# Patient Record
Sex: Female | Born: 1960 | Race: White | Hispanic: No | Smoking: Never smoker
Health system: Southern US, Community
[De-identification: ages and names within clinical notes are randomized; demographics above are authoritative.]

## PROBLEM LIST (undated history)

## (undated) DIAGNOSIS — I82409 Acute embolism and thrombosis of unspecified deep veins of unspecified lower extremity: Secondary | ICD-10-CM

## (undated) DIAGNOSIS — I10 Essential (primary) hypertension: Secondary | ICD-10-CM

## (undated) DIAGNOSIS — F329 Major depressive disorder, single episode, unspecified: Secondary | ICD-10-CM

## (undated) DIAGNOSIS — F419 Anxiety disorder, unspecified: Secondary | ICD-10-CM

## (undated) DIAGNOSIS — F32A Depression, unspecified: Secondary | ICD-10-CM

## (undated) DIAGNOSIS — I2699 Other pulmonary embolism without acute cor pulmonale: Secondary | ICD-10-CM

## (undated) HISTORY — PX: ABDOMINAL HYSTERECTOMY: SHX81

## (undated) HISTORY — PX: BACK SURGERY: SHX140

## (undated) HISTORY — PX: CHOLECYSTECTOMY: SHX55

## (undated) HISTORY — PX: APPENDECTOMY: SHX54

---

## 1998-03-14 ENCOUNTER — Inpatient Hospital Stay (HOSPITAL_COMMUNITY): Admission: RE | Admit: 1998-03-14 | Discharge: 1998-03-16 | Payer: Self-pay | Admitting: Neurosurgery

## 1998-08-22 ENCOUNTER — Encounter: Payer: Self-pay | Admitting: Neurosurgery

## 1998-08-22 ENCOUNTER — Inpatient Hospital Stay (HOSPITAL_COMMUNITY): Admission: RE | Admit: 1998-08-22 | Discharge: 1998-08-25 | Payer: Self-pay | Admitting: Neurosurgery

## 1999-12-29 ENCOUNTER — Encounter: Payer: Self-pay | Admitting: Neurosurgery

## 1999-12-29 ENCOUNTER — Encounter: Admission: RE | Admit: 1999-12-29 | Discharge: 1999-12-29 | Payer: Self-pay | Admitting: Neurosurgery

## 2000-01-10 ENCOUNTER — Encounter: Payer: Self-pay | Admitting: Neurosurgery

## 2000-01-10 ENCOUNTER — Ambulatory Visit (HOSPITAL_COMMUNITY): Admission: RE | Admit: 2000-01-10 | Discharge: 2000-01-10 | Payer: Self-pay | Admitting: Neurosurgery

## 2002-04-26 ENCOUNTER — Ambulatory Visit (HOSPITAL_COMMUNITY): Admission: RE | Admit: 2002-04-26 | Discharge: 2002-04-26 | Payer: Self-pay | Admitting: Neurosurgery

## 2002-04-26 ENCOUNTER — Encounter: Payer: Self-pay | Admitting: Neurosurgery

## 2002-05-14 ENCOUNTER — Encounter: Payer: Self-pay | Admitting: Neurosurgery

## 2002-05-16 ENCOUNTER — Encounter: Payer: Self-pay | Admitting: Neurosurgery

## 2002-05-16 ENCOUNTER — Inpatient Hospital Stay (HOSPITAL_COMMUNITY): Admission: RE | Admit: 2002-05-16 | Discharge: 2002-05-18 | Payer: Self-pay | Admitting: Neurosurgery

## 2002-11-08 ENCOUNTER — Encounter: Admission: RE | Admit: 2002-11-08 | Discharge: 2002-11-08 | Payer: Self-pay | Admitting: Orthopaedic Surgery

## 2002-11-08 ENCOUNTER — Encounter: Payer: Self-pay | Admitting: Orthopaedic Surgery

## 2003-04-16 ENCOUNTER — Encounter: Payer: Self-pay | Admitting: Emergency Medicine

## 2003-04-16 ENCOUNTER — Ambulatory Visit (HOSPITAL_COMMUNITY): Admission: RE | Admit: 2003-04-16 | Discharge: 2003-04-16 | Payer: Self-pay | Admitting: Neurosurgery

## 2003-04-16 ENCOUNTER — Emergency Department (HOSPITAL_COMMUNITY): Admission: AD | Admit: 2003-04-16 | Discharge: 2003-04-16 | Payer: Self-pay | Admitting: Emergency Medicine

## 2003-04-16 ENCOUNTER — Encounter: Payer: Self-pay | Admitting: Neurosurgery

## 2004-05-10 ENCOUNTER — Ambulatory Visit (HOSPITAL_COMMUNITY): Admission: RE | Admit: 2004-05-10 | Discharge: 2004-05-10 | Payer: Self-pay | Admitting: Neurosurgery

## 2004-12-20 ENCOUNTER — Ambulatory Visit (HOSPITAL_COMMUNITY): Admission: RE | Admit: 2004-12-20 | Discharge: 2004-12-20 | Payer: Self-pay | Admitting: Neurosurgery

## 2005-03-15 ENCOUNTER — Emergency Department (HOSPITAL_COMMUNITY): Admission: EM | Admit: 2005-03-15 | Discharge: 2005-03-15 | Payer: Self-pay | Admitting: Emergency Medicine

## 2005-07-15 ENCOUNTER — Ambulatory Visit: Payer: Self-pay | Admitting: Cardiology

## 2005-09-01 ENCOUNTER — Emergency Department (HOSPITAL_COMMUNITY): Admission: EM | Admit: 2005-09-01 | Discharge: 2005-09-01 | Payer: Self-pay | Admitting: Emergency Medicine

## 2005-09-01 ENCOUNTER — Ambulatory Visit (HOSPITAL_COMMUNITY): Admission: RE | Admit: 2005-09-01 | Discharge: 2005-09-01 | Payer: Self-pay | Admitting: Neurosurgery

## 2007-11-06 ENCOUNTER — Ambulatory Visit: Payer: Self-pay | Admitting: Cardiology

## 2008-05-15 ENCOUNTER — Emergency Department (HOSPITAL_COMMUNITY): Admission: EM | Admit: 2008-05-15 | Discharge: 2008-05-16 | Payer: Self-pay | Admitting: Emergency Medicine

## 2010-01-16 ENCOUNTER — Ambulatory Visit: Payer: Self-pay | Admitting: Cardiology

## 2010-10-25 ENCOUNTER — Encounter: Payer: Self-pay | Admitting: Neurosurgery

## 2011-02-19 NOTE — H&P (Signed)
Deanna Brandt, Deanna Brandt                       ACCOUNT NO.:  [id number]   MEDICAL RECORD NO.:  [id number]                   PATIENT TYPE:  OIB   LOCATION:  3011                                 FACILITY:  [facility]   PHYSICIAN:  Payton Doughty, M.D.                   DATE OF BIRTH:  06/08/1961   DATE OF ADMISSION:  05/16/2002  DATE OF DISCHARGE:  05/18/2002                                HISTORY & PHYSICAL   ADMISSION DIAGNOSES:  Herniated disc, C6-C7 on the left.   HISTORY OF PRESENT ILLNESS:  This is a now 50 year old right-handed white  female who has been my patient for a number of years.  In June she started  complaining of pain in her shoulder and left arm, numbness into the index  and middle fingers on the left hand.  After much ado an MRI was obtained  that demonstrated a herniated disc at C6-C7 on the left and she is admitted  for anterior cervicectomy and fusion.   PAST MEDICAL HISTORY:  Her medical history is remarkable for a lumbar fusion  in 1999 at L4-L5.  Other than that she does not have any medical  difficulties.   MEDICATIONS ON ADMISSION:  Estrace and Vicodin on a p.r.n. basis.   PAST SURGICAL HISTORY:  She has also had a hysterectomy in the past.   ALLERGIES:  She has no allergies.   SOCIAL HISTORY:  She does not smoke and does not drink.  She is currently  unemployed.   FAMILY HISTORY:  Mother is 42 and in fair health.  Father is 27 and in fair  health.  No medical problems are elicited.   REVIEW OF SYSTEMS:  Remarkable for neck, shoulder and arm pain.   PHYSICAL EXAMINATION:  HEENT:  Examination is within normal limits.  NECK:  Supple with no lymphadenopathy.  Turning it towards the left  reproduces her shoulder and arm pain, as does extension.  CHEST:  Clear.  CARDIOVASCULAR:  Regular rate and rhythm.  ABDOMEN:  Nontender with no hepatosplenomegaly.  EXTREMITIES:  Without clubbing or cyanosis.  Peripheral pulses are good.  GENITOURINARY:  Examination is  deferred.  NEUROLOGIC:  She is awake, alert and oriented.  Cranial nerves are intact.  Motor examination shows 5 out of 5 strength throughout the upper and lower  extremities save for the left triceps which is 4 over 5.  In the biceps  feels like there is give-way weakness but could be mustered to full  strength.  Left C7 sensory deficit is described.  Deep tendon reflexes are 2  at the biceps bilaterally, absent at the left triceps, and 1 at the right  triceps.  Hoffmann's is negative.   MRI demonstrates a herniated disc at C6-C7 and in the left neural foramen.   CLINICAL IMPRESSION:  Left C7 radiculopathy related to herniated disc at C6-  C7.   PLAN:  The  plan is for anterior cervicectomy and fusion at C6-C7.  The risks  and benefits of this approach have been discussed with her and she wishes to  proceed.                                               Payton Doughty, M.D.    MWR/MEDQ  D:  05/16/2002  T:  05/19/2002  Job:  [id number]

## 2011-02-19 NOTE — Op Note (Signed)
[name], Deanna Brandt                       ACCOUNT NO.:  [id number]   MEDICAL RECORD NO.:  [id number]                   PATIENT TYPE:  OIB   LOCATION:  3011                                 FACILITY:  [facility]   PHYSICIAN:  Payton Doughty, M.D.                   DATE OF BIRTH:  [birth year]   DATE OF PROCEDURE:  05/16/2002  DATE OF DISCHARGE:                                 OPERATIVE REPORT   PREOPERATIVE DIAGNOSIS:  Herniated disk C6-7.   POSTOPERATIVE DIAGNOSIS:  Herniated disk C6-7.   OPERATIVE PROCEDURE:  C6-7 anterior cervical discectomy and fusion.  Feather  plate.   SURGEON:  Payton Doughty, M.D.   ANESTHESIA:  General endotracheal.   __________  with alcohol wipe.   COMPLICATIONS:  None.   ASSISTANT:  [place].   BODY OF TEXT:  A 50 year old right-handed white girl with a left C7  radiculopathy, secondary to herniated disk at C6-7.  She was taken to the  operating room, intubated and placed supine on the operating room table.  The patient was shaved, prepped and draped in the usual sterile fashion.  Skin was incised in the midline to the sternocleidomastoid muscle on the  left side.  The platysma was identified, elevated and divided and  undermined.  The sternocleidomastoid was identified and medial dissection  around the carotid artery, retracted laterally to the left.  Trachea and  esophagus were retracted laterally to the right, exposing the bone to the  anterior cervical spine.  Marker was placed.  Intraoperative x-ray obtained  to confirm correctness of level.  After confirming the correctness of level,  dissection is carried out under gross observation.  The operating microscope  was then brought in and microdissection technique used to the dissect the  interior epidural space, remove the disk and carefully dissect the nerve  roots.  On the left side there is a large fragment of annulus that had been  pushed into the neural foramen, significantly compressing the C7  root.  This  was removed without difficulty.  The nerve root carefully explored and found  to be free in all quadrants.  Bleeding was controlled with thrombin-soaked  Gelfoam, which was subsequently removed.  The wound was irrigated and  hemostasis assured.  A 7 mm bone graft fashioned from the patellar allograft  and tapped into place.  A 14 mm toe-in plate was then used with 12 mm screws  -- two in C6 and two in C7.  Intraoperative x-ray confirmed correctness of  level, good placement of bone graft, plate and screws.  The platysma was  reapproximated with 0 Vicryl in interrupted fashion.  The subcutaneous  tissue was reapproximated with 3-0 Vicryl in interrupted fashion.  The skin  was closed with 4-0 Vicryl in running subcuticular fashion.  Benzoin and  Steri-Strips were placed.  Made occlusive with Telfa and OpSite.  The  patient placed in  an Biochemist, clinical; returned to the recovery room in good  condition.                                               Payton Doughty, M.D.   MWR/MEDQ  D:  05/16/2002  T:  05/18/2002  Job:  [id number]

## 2011-06-08 ENCOUNTER — Other Ambulatory Visit: Payer: Self-pay

## 2011-06-08 ENCOUNTER — Emergency Department (HOSPITAL_COMMUNITY): Payer: Medicare Other

## 2011-06-08 ENCOUNTER — Observation Stay (HOSPITAL_COMMUNITY)
Admission: EM | Admit: 2011-06-08 | Discharge: 2011-06-09 | Disposition: A | Discharge status: Home / Self Care | Payer: Medicare Other | Attending: Internal Medicine | Admitting: Internal Medicine

## 2011-06-08 ENCOUNTER — Encounter: Payer: Self-pay | Admitting: Emergency Medicine

## 2011-06-08 DIAGNOSIS — Z79899 Other long term (current) drug therapy: Secondary | ICD-10-CM | POA: Insufficient documentation

## 2011-06-08 DIAGNOSIS — R109 Unspecified abdominal pain: Secondary | ICD-10-CM | POA: Insufficient documentation

## 2011-06-08 DIAGNOSIS — F411 Generalized anxiety disorder: Secondary | ICD-10-CM | POA: Insufficient documentation

## 2011-06-08 DIAGNOSIS — M549 Dorsalgia, unspecified: Secondary | ICD-10-CM | POA: Insufficient documentation

## 2011-06-08 DIAGNOSIS — D649 Anemia, unspecified: Secondary | ICD-10-CM | POA: Insufficient documentation

## 2011-06-08 DIAGNOSIS — R0789 Other chest pain: Principal | ICD-10-CM | POA: Insufficient documentation

## 2011-06-08 DIAGNOSIS — R079 Chest pain, unspecified: Secondary | ICD-10-CM | POA: Diagnosis present

## 2011-06-08 DIAGNOSIS — F329 Major depressive disorder, single episode, unspecified: Secondary | ICD-10-CM | POA: Diagnosis present

## 2011-06-08 DIAGNOSIS — Z86718 Personal history of other venous thrombosis and embolism: Secondary | ICD-10-CM | POA: Insufficient documentation

## 2011-06-08 DIAGNOSIS — F3289 Other specified depressive episodes: Secondary | ICD-10-CM | POA: Insufficient documentation

## 2011-06-08 DIAGNOSIS — R0602 Shortness of breath: Secondary | ICD-10-CM | POA: Insufficient documentation

## 2011-06-08 DIAGNOSIS — G8929 Other chronic pain: Secondary | ICD-10-CM | POA: Insufficient documentation

## 2011-06-08 DIAGNOSIS — R112 Nausea with vomiting, unspecified: Secondary | ICD-10-CM | POA: Insufficient documentation

## 2011-06-08 HISTORY — DX: Major depressive disorder, single episode, unspecified: F32.9

## 2011-06-08 HISTORY — DX: Acute embolism and thrombosis of unspecified deep veins of unspecified lower extremity: I82.409

## 2011-06-08 HISTORY — DX: Anxiety disorder, unspecified: F41.9

## 2011-06-08 HISTORY — DX: Depression, unspecified: F32.A

## 2011-06-08 LAB — TROPONIN I
Troponin I: <0.3 ng/mL (ref <0.30)
Troponin I: <0.3 ng/mL (ref <0.30)

## 2011-06-08 LAB — D-DIMER, QUANTITATIVE: D-Dimer, Quant: 0.53 ug/mL-FEU — ABNORMAL HIGH (ref 0.00–0.48)

## 2011-06-08 LAB — CBC
HCT: 34.9 % — ABNORMAL LOW (ref 36.0–46.0)
Hemoglobin: 11.7 g/dL — ABNORMAL LOW (ref 12.0–15.0)
MCH: 28.6 pg (ref 26.0–34.0)
MCHC: 33.5 g/dL (ref 30.0–36.0)
MCV: 85.3 fL (ref 78.0–100.0)
Platelets: 218 10*3/uL (ref 150–400)
RBC: 4.09 MIL/uL (ref 3.87–5.11)
RDW: 14.3 % (ref 11.5–15.5)
WBC: 6.8 10*3/uL (ref 4.0–10.5)

## 2011-06-08 LAB — COMPREHENSIVE METABOLIC PANEL
ALT: 16 U/L (ref 0–35)
BUN: 17 mg/dL (ref 6–23)
Chloride: 104 mEq/L (ref 96–112)
GFR calc non Af Amer: >60 mL/min (ref >60)
Glucose, Bld: 106 mg/dL — ABNORMAL HIGH (ref 70–99)

## 2011-06-08 LAB — URINALYSIS, ROUTINE W REFLEX MICROSCOPIC
Ketones, ur: NEGATIVE mg/dL
Leukocytes, UA: NEGATIVE
Nitrite: NEGATIVE
Protein, ur: NEGATIVE mg/dL
Urobilinogen, UA: 0.2 mg/dL (ref 0.0–1.0)

## 2011-06-08 LAB — CK TOTAL AND CKMB (NOT AT ARMC)
Relative Index: INVALID (ref 0.0–2.5)
Total CK: 73 U/L (ref 7–177)

## 2011-06-08 MED ORDER — MORPHINE SULFATE 10 MG/ML IJ SOLN
INTRAMUSCULAR | Status: AC
  Administered 2011-06-08: 6 mg
  Filled 2011-06-08: qty 1

## 2011-06-08 MED ORDER — MORPHINE SULFATE 4 MG/ML IJ SOLN
4.0000 mg | Freq: Once | INTRAMUSCULAR | Status: AC
  Administered 2011-06-08: 4 mg via INTRAVENOUS
  Filled 2011-06-08: qty 1

## 2011-06-08 MED ORDER — NITROGLYCERIN 0.4 MG SL SUBL
0.4000 mg | SUBLINGUAL_TABLET | Freq: Once | SUBLINGUAL | Status: AC
  Administered 2011-06-08: 0.4 mg via SUBLINGUAL
  Filled 2011-06-08: qty 25

## 2011-06-08 MED ORDER — MORPHINE SULFATE 4 MG/ML IJ SOLN
INTRAMUSCULAR | Status: AC
  Administered 2011-06-08: 4 mg
  Filled 2011-06-08: qty 1

## 2011-06-08 MED ORDER — PROMETHAZINE HCL 25 MG/ML IJ SOLN
25.0000 mg | Freq: Four times a day (QID) | INTRAMUSCULAR | Status: DC | PRN
  Administered 2011-06-08: 25 mg via INTRAVENOUS
  Filled 2011-06-08: qty 1

## 2011-06-08 MED ORDER — CLOPIDOGREL BISULFATE 75 MG PO TABS
150.0000 mg | ORAL_TABLET | Freq: Once | ORAL | Status: AC
  Administered 2011-06-08: 150 mg via ORAL
  Filled 2011-06-08: qty 2

## 2011-06-08 MED ORDER — LORAZEPAM 2 MG/ML IJ SOLN
1.0000 mg | Freq: Once | INTRAMUSCULAR | Status: AC
  Administered 2011-06-08: 1 mg via INTRAVENOUS
  Filled 2011-06-08: qty 1

## 2011-06-08 NOTE — ED Provider Notes (Signed)
History  Scribed for Dr. Patria Mane the patient was seen in room 12. The chart was scribed by Gilman Schmidt. The patients care was started at 1628.  CSN: [id number] Arrival date & time: 06/08/2011  3:45 PM  Chief Complaint  Patient presents with  . Headache  . Chest Pain  . Shoulder Pain  . Emesis   HPI Deanna Brandt is a 50 y.o. female who presents to the Emergency Department complaining of constant chest pain that began ~1 hour ago. Pt describes the chest pain as feeling like someone is sitting on her chest and reports that is is radiating to her left shoulder. Pt. Reports that chest pain is worsening but is not exacerbated by deep breaths or palpations. Additionally, patient reports a history of blood clot in the legs and states that she was on Coumadin for three months. Associated symptoms include headache, dysuria, nausea but denies any new swelling, fever, cough, congestion, or SOB.   HPI ELEMENTS:  Location: chest Onset: ~1 hour ago Duration: consistent since onset  Timing: constant  Quality: "feels like someone is sitting on her chest" Context:  as above  Associated symptoms: headache and nausea but denies any new swelling, fever, cough, congestion, or SOB.     PAST MEDICAL HISTORY:  Past Medical History  Diagnosis Date  . Depression   . Anxiety   . Migraine   . DVT (deep venous thrombosis)   Denies any history of hypertension, hyperlipidemia, DM   PAST SURGICAL HISTORY:  Past Surgical History  Procedure Date  . Back surgery   . Cholecystectomy   . Appendectomy   . Abdominal hysterectomy      MEDICATIONS:  Previous Medications   ALPRAZOLAM (XANAX) 1 MG TABLET    Take 1 mg by mouth 3 (three) times daily as needed. For anxiety     TIZANIDINE (ZANAFLEX) 2 MG CAPSULE    Take 4 mg by mouth 3 (three) times daily as needed. For muscle spasms    ZIPRASIDONE (GEODON) 60 MG CAPSULE    Take 60 mg by mouth daily.     ZOLPIDEM (AMBIEN) 10 MG TABLET    Take 10 mg by mouth at  bedtime as needed. For sleep      ALLERGIES:  Allergies as of 06/08/2011 - Review Complete 06/08/2011  Allergen Reaction Noted  . Aspirin Anaphylaxis 06/08/2011  . Bee venom Anaphylaxis 06/08/2011  . Compazine Hives 06/08/2011  . Ibuprofen Nausea And Vomiting 06/08/2011  . Zofran Hives 06/08/2011  . Ivp dye (iodinated diagnostic agents) Rash 06/08/2011     FAMILY HISTORY:  Denies any young heart problems in family.    SOCIAL HISTORY: Pt reports recent travel to see her daughter who has been having seizures.  History  Substance Use Topics  . Smoking status: Never Smoker   . Smokeless tobacco: Not on file  . Alcohol Use: No   Denies any smoking.   Review of Systems  Constitutional: Negative for fever.  HENT: Negative for congestion.   Respiratory: Negative for cough and shortness of breath.   Cardiovascular: Positive for chest pain.  Gastrointestinal: Positive for nausea and vomiting.  Genitourinary: Positive for dysuria.  Musculoskeletal:       Shoulder pain  Neurological: Positive for headaches.  All other systems reviewed and are negative.    Physical Exam  BP 144/96  Pulse 88  Temp(Src) 98.9 F (37.2 C) (Oral)  Resp 17  Ht 5\' 5"  (1.651 m)  Wt 165 lb (74.844 kg)  BMI  27.46 kg/m2  SpO2 100%  Physical Exam  Constitutional: She is oriented to person, place, and time. She appears well-developed and well-nourished.  HENT:  Head: Normocephalic.  Eyes: Pupils are equal, round, and reactive to light.  Neck: Normal range of motion and phonation normal.  Cardiovascular: Normal rate, regular rhythm, normal heart sounds and intact distal pulses.   Pulmonary/Chest: Effort normal and breath sounds normal. She exhibits no bony tenderness.  Abdominal: Soft. Normal appearance. There is tenderness in the right upper quadrant, epigastric area and suprapubic area.  Musculoskeletal: Normal range of motion.       No unilateral swelling of legs  Neurological: She is alert and  oriented to person, place, and time. She has normal strength. No cranial nerve deficit or sensory deficit. She exhibits normal muscle tone. Coordination normal.  Skin: Skin is warm, dry and intact.  Psychiatric: She has a normal mood and affect. Her behavior is normal. Judgment and thought content normal.   OTHER DATA REVIEWED: Nursing notes, vital signs, and past medical records reviewed.   DIAGNOSTIC STUDIES: Oxygen Saturation is 100% on room air, normal by my interpretation.    LABS:  Results for orders placed during the hospital encounter of 06/08/11  CBC      Component Value Range   WBC 6.8  4.0 - 10.5 (K/uL)   RBC 4.09  3.87 - 5.11 (MIL/uL)   Hemoglobin 11.7 (*) 12.0 - 15.0 (g/dL)   HCT 04.5 (*) 40.9 - 46.0 (%)   MCV 85.3  78.0 - 100.0 (fL)   MCH 28.6  26.0 - 34.0 (pg)   MCHC 33.5  30.0 - 36.0 (g/dL)   RDW 81.1  91.4 - 78.2 (%)   Platelets 218  150 - 400 (K/uL)  TROPONIN I      Component Value Range   Troponin I <0.30  <0.30 (ng/mL)  CK TOTAL AND CKMB      Component Value Range   Total CK 73  7 - 177 (U/L)   CK, MB 2.9  0.3 - 4.0 (ng/mL)   Relative Index RELATIVE INDEX IS INVALID  0.0 - 2.5   LIPASE, BLOOD      Component Value Range   Lipase 58  11 - 59 (U/L)  COMPREHENSIVE METABOLIC PANEL      Component Value Range   Sodium 137  135 - 145 (mEq/L)   Potassium 3.4 (*) 3.5 - 5.1 (mEq/L)   Chloride 104  96 - 112 (mEq/L)   CO2 24  19 - 32 (mEq/L)   Glucose, Bld 106 (*) 70 - 99 (mg/dL)   BUN 17  6 - 23 (mg/dL)   Creatinine, Ser 9.56  0.50 - 1.10 (mg/dL)   Calcium 9.5  8.4 - 21.3 (mg/dL)   Total Protein 7.3  6.0 - 8.3 (g/dL)   Albumin 4.3  3.5 - 5.2 (g/dL)   AST 13  0 - 37 (U/L)   ALT 16  0 - 35 (U/L)   Alkaline Phosphatase 105  39 - 117 (U/L)   Total Bilirubin 2.0 (*) 0.3 - 1.2 (mg/dL)   GFR calc non Af Amer >60  >60 (mL/min)   GFR calc Af Amer >60  >60 (mL/min)  URINALYSIS, ROUTINE W REFLEX MICROSCOPIC      Component Value Range   Color, Urine AMBER (*) YELLOW     Appearance CLEAR  CLEAR    Specific Gravity, Urine 1.020  1.005 - 1.030    pH 6.0  5.0 - 8.0  Glucose, UA NEGATIVE  NEGATIVE (mg/dL)   Hgb urine dipstick NEGATIVE  NEGATIVE    Bilirubin Urine NEGATIVE  NEGATIVE    Ketones, ur NEGATIVE  NEGATIVE (mg/dL)   Protein, ur NEGATIVE  NEGATIVE (mg/dL)   Urobilinogen, UA 0.2  0.0 - 1.0 (mg/dL)   Nitrite NEGATIVE  NEGATIVE    Leukocytes, UA NEGATIVE  NEGATIVE   TROPONIN I      Component Value Range   Troponin I <0.30  <0.30 (ng/mL)  D-DIMER, QUANTITATIVE      Component Value Range   D-Dimer, Quant 0.53 (*) 0.00 - 0.48 (ug/mL-FEU)  PRO B NATRIURETIC PEPTIDE      Component Value Range   BNP, POC 15.6  0 - 125 (pg/mL)  RETICULOCYTES      Component Value Range   Retic Ct Pct 1.1  0.4 - 3.1 (%)   RBC. 4.09  3.87 - 5.11 (MIL/uL)   Retic Count, Manual 45.0  19.0 - 186.0 (K/uL)  PROTIME-INR      Component Value Range   Prothrombin Time 18.0 (*) 11.6 - 15.2 (seconds)   INR 1.46  0.00 - 1.49   APTT      Component Value Range   aPTT 36  24 - 37 (seconds)  COMPREHENSIVE METABOLIC PANEL      Component Value Range   Sodium 142  135 - 145 (mEq/L)   Potassium 3.7  3.5 - 5.1 (mEq/L)   Chloride 110  96 - 112 (mEq/L)   CO2 24  19 - 32 (mEq/L)   Glucose, Bld 89  70 - 99 (mg/dL)   BUN 10  6 - 23 (mg/dL)   Creatinine, Ser 4.54  0.50 - 1.10 (mg/dL)   Calcium 9.3  8.4 - 09.8 (mg/dL)   Total Protein 6.8  6.0 - 8.3 (g/dL)   Albumin 3.9  3.5 - 5.2 (g/dL)   AST 13  0 - 37 (U/L)   ALT 14  0 - 35 (U/L)   Alkaline Phosphatase 97  39 - 117 (U/L)   Total Bilirubin 2.0 (*) 0.3 - 1.2 (mg/dL)   GFR calc non Af Amer >60  >60 (mL/min)   GFR calc Af Amer >60  >60 (mL/min)  CBC      Component Value Range   WBC 5.6  4.0 - 10.5 (K/uL)   RBC 4.01  3.87 - 5.11 (MIL/uL)   Hemoglobin 11.5 (*) 12.0 - 15.0 (g/dL)   HCT 11.9 (*) 14.7 - 46.0 (%)   MCV 86.8  78.0 - 100.0 (fL)   MCH 28.7  26.0 - 34.0 (pg)   MCHC 33.0  30.0 - 36.0 (g/dL)   RDW 82.9  56.2 - 13.0 (%)    Platelets 224  150 - 400 (K/uL)  LIPID PANEL      Component Value Range   Cholesterol 61  0 - 200 (mg/dL)   Triglycerides 49  <865 (mg/dL)   HDL 21 (*) >78 (mg/dL)   Total CHOL/HDL Ratio 2.9     VLDL 10  0 - 40 (mg/dL)   LDL Cholesterol 30  0 - 99 (mg/dL)  CARDIAC PANEL(CRET KIN+CKTOT+MB+TROPI)      Component Value Range   Total CK 82  7 - 177 (U/L)   CK, MB 3.2  0.3 - 4.0 (ng/mL)   Troponin I <0.30  <0.30 (ng/mL)   Relative Index RELATIVE INDEX IS INVALID  0.0 - 2.5    I personally reviewed the labs and xrays  RADIOLOGY:  DG Chest  2 View; Reviewed by me: IMPRESSION: Negative exam. Original Report Authenticated By: Patterson Hammersmith, M.D.    MDM:  Enzymes x 2 negative. Low risk for PE. Subtle ECG changes on second ecg. Will admit for low risk CP evaluation. Spoke with [facility] who will evaluate in ER for admission   Date: 06/08/2011  Rate:89  Rhythm: normal sinus rhythm  QRS Axis: normal  Intervals: normal  ST/T Wave abnormalities: nonspecific ST changes  Conduction Disutrbances:none  Narrative Interpretation:   Old EKG Reviewed: none available    IMPRESSION: Diagnoses that have been ruled out:  Diagnoses that are still under consideration:  Final diagnoses:  Chest pain    PLAN:  Admit   MEDICATIONS GIVEN IN THE E.D.  Medications  zolpidem (AMBIEN) 10 MG tablet (not administered)  ALPRAZolam (XANAX) 1 MG tablet (not administered)  ziprasidone (GEODON) 60 MG capsule (not administered)  tizanidine (ZANAFLEX) 2 MG capsule (not administered)  ALPRAZolam (XANAX) tablet 1 mg (not administered)  tiZANidine (ZANAFLEX) tablet 4 mg (not administered)  ziprasidone (GEODON) capsule 60 mg (60 mg Oral Given 06/09/11 1034)  zolpidem (AMBIEN) tablet 10 mg (10 mg Oral Given 06/09/11 0140)  enoxaparin (LOVENOX) injection 40 mg (40 mg Subcutaneous Given 06/09/11 1034)  sodium chloride 0.9 % injection 3 mL (3 mL Intravenous Not Given 06/09/11 1000)  sodium chloride  0.9 % injection 3 mL (not administered)  0.9 %  sodium chloride infusion (250 mL Intravenous New Bag 06/09/11 0132)  acetaminophen (TYLENOL) tablet 650 mg (650 mg Oral Given 06/09/11 0809)    Or  acetaminophen (TYLENOL) suppository 650 mg (  Rectal See Alternative 06/09/11 0809)  oxyCODONE (Oxy IR/ROXICODONE) immediate release tablet 5 mg (not administered)  morphine injection 2 mg (2 mg Intravenous Given 06/09/11 1035)  senna (SENOKOT) tablet 17.2 mg (not administered)  promethazine (PHENERGAN) tablet 12.5 mg (  Oral See Alternative 06/09/11 0807)    Or  promethazine (PHENERGAN) injection 12.5 mg (12.5 mg Intravenous Given 06/09/11 0807)  albuterol (PROVENTIL) (5 MG/ML) 0.5% nebulizer solution 2.5 mg (not administered)  nitroGLYCERIN (NITROGLYN) 2 % ointment 0.5 inch (0.5 inch Topical Given 06/09/11 0614)  pantoprazole (PROTONIX) EC tablet 40 mg (not administered)  sodium chloride 0.9 % injection ( mL  Not Given 06/09/11 0130)  morphine injection 4 mg (4 mg Intravenous Given 06/08/11 1742)  LORazepam (ATIVAN) injection 1 mg (1 mg Intravenous Given 06/08/11 1742)  morphine 4 MG/ML injection (4 mg  Given 06/08/11 1930)  clopidogrel (PLAVIX) tablet 150 mg (150 mg Oral Given 06/08/11 2002)  nitroGLYCERIN (NITROSTAT) SL tablet 0.4 mg (0.4 mg Sublingual Given 06/08/11 2003)  morphine 10 MG/ML injection (6 mg  Given 06/08/11 2121)  potassium chloride SA (K-DUR,KLOR-CON) CR tablet 40 mEq (40 mEq Oral Given 06/09/11 0140)  technetium albumin aggregated (MAA) injection solution 6 milli Curie (5.5 milli Curie Intravenous Contrast Given 06/09/11 0015)  xenon xe 133 gas 10 milli Curie (21.6 milli Curie Inhalation Contrast Given 06/09/11 0010)  sodium chloride 0.9 % injection ( mL  Given 06/09/11 0807)    SCRIBE ATTESTATION:  I personally performed the services described in this documentation, which was scribed in my presence. The recorded information has been reviewed and considered. Rebbecca Osuna M      Procedures        Lyanne Co, MD 06/09/11 1130

## 2011-06-08 NOTE — H&P (Signed)
Deanna Brandt is an 50 y.o. female.  She is visiting here from [place]. She doesn't have a primary care physician. She does, however, have a psychiatrist.  Chief Complaint: Chest pain  HPI: This is a 50 year old, Caucasian female, who has history of depression, anxiety, chronic back pain, who was in her usual state of health earlier today when she was visiting her daughter here in the hospital. Her daughter, is admitted. And, while she was visiting Deanna Brandt had onset of chest pain, which was in the retrosternal area radiating to the left shoulder. She had 2 episodes of emesis and nausea. She felt like someone was sitting on her chest. The pain was 10 out of 10 in intensity. Currently, is 5/10 in intensity. There is no history of cough. No fever, chills. The pain does increase with deep breathing. Denies any leg swelling. Denies any gout, PND. Denies any palpitations. Denies any diaphoresis. No real precipitating or aggravating factors identified. No alleviating factors identified.   Prior to Admission medications   Medication Sig Start Date End Date Taking? Authorizing Provider  ALPRAZolam Prudy Feeler) 1 MG tablet Take 1 mg by mouth 3 (three) times daily as needed. For anxiety     Yes Historical Provider, MD  tizanidine (ZANAFLEX) 2 MG capsule Take 4 mg by mouth 3 (three) times daily as needed. For muscle spasms    Yes Historical Provider, MD  ziprasidone (GEODON) 60 MG capsule Take 60 mg by mouth daily.     Yes Historical Provider, MD  zolpidem (AMBIEN) 10 MG tablet Take 10 mg by mouth at bedtime as needed. For sleep    Yes Historical Provider, MD    Allergies:  Allergies  Allergen Reactions  . Aspirin Anaphylaxis  . Bee Venom Anaphylaxis  . Compazine Hives  . Ibuprofen Nausea And Vomiting  . Zofran Hives  . Ivp Dye (Iodinated Diagnostic Agents) Rash    Past Medical History  Diagnosis Date  . Depression   . Anxiety   . Migraine    She tells me that she was diagnosed with a DVT  in February of 2011. She does not know if she had a pulmonary embolism or not. She was told that she had a DVT, because she was on hormonal pills at that time. She was treated with Coumadin for about 2-3 months. There is a history of blood clots in her family as well.  Past Surgical History  Procedure Date  . Back surgery   . Cholecystectomy   . Appendectomy     Social History:  does not have a smoking history on file. She does not have any smokeless tobacco history on file. She reports that she does not drink alcohol or use illicit drugs.  Family History: History reviewed. No pertinent family history.  Review of Systems  Constitutional: Negative.   HENT: Negative.   Eyes: Negative.   Respiratory: Positive for shortness of breath.   Cardiovascular: Positive for chest pain. Negative for orthopnea and leg swelling.  Gastrointestinal: Positive for nausea, vomiting and abdominal pain.  Genitourinary: Negative.   Musculoskeletal: Positive for back pain.  Skin: Negative.   Neurological: Negative.   Endo/Heme/Allergies: Bruises/bleeds easily.  Psychiatric/Behavioral: The Deanna Brandt is nervous/anxious.     Blood pressure 137/91, pulse 96, temperature 98.9 F (37.2 C), temperature source Oral, resp. rate 17, height 5\' 5"  (1.651 m), weight 74.844 kg (165 lb), SpO2 97.00%. Physical Exam  Vitals reviewed. Constitutional: She is oriented to person, place, and time. She appears well-developed and  well-nourished. No distress.  HENT:  Head: Normocephalic and atraumatic.  Mouth/Throat: No oropharyngeal exudate.  Eyes: Conjunctivae and EOM are normal. Pupils are equal, round, and reactive to light. Right eye exhibits no discharge. Left eye exhibits no discharge. No scleral icterus.  Neck: Normal range of motion. Neck supple. No JVD present. No tracheal deviation present. No thyromegaly present.  Cardiovascular: Normal rate, regular rhythm and normal heart sounds.  Exam reveals no gallop and no  friction rub.   No murmur heard. Pulmonary/Chest: Effort normal and breath sounds normal. No stridor. No respiratory distress. She has no wheezes. She has no rales. She exhibits no tenderness.  Abdominal: Soft. She exhibits no mass. There is no hepatosplenomegaly. There is generalized tenderness. There is no rigidity, no rebound, no guarding, no tenderness at McBurney's point and negative Murphy's sign.  Musculoskeletal: Normal range of motion.  Lymphadenopathy:    She has no cervical adenopathy.  Neurological: She is alert and oriented to person, place, and time. No cranial nerve deficit.  Skin: Skin is warm and dry. She is not diaphoretic.  Psychiatric: She has a normal mood and affect.     Results for orders placed during the hospital encounter of 06/08/11 (from the past 48 hour(s))  CBC     Status: Abnormal   Collection Time   06/08/11  5:09 PM      Component Value Range Comment   WBC 6.8  4.0 - 10.5 (K/uL)    RBC 4.09  3.87 - 5.11 (MIL/uL)    Hemoglobin 11.7 (*) 12.0 - 15.0 (g/dL)    HCT 16.1 (*) 09.6 - 46.0 (%)    MCV 85.3  78.0 - 100.0 (fL)    MCH 28.6  26.0 - 34.0 (pg)    MCHC 33.5  30.0 - 36.0 (g/dL)    RDW 04.5  40.9 - 81.1 (%)    Platelets 218  150 - 400 (K/uL)   TROPONIN I     Status: Normal   Collection Time   06/08/11  5:09 PM      Component Value Range Comment   Troponin I <0.30  <0.30 (ng/mL)   CK TOTAL AND CKMB     Status: Normal   Collection Time   06/08/11  5:09 PM      Component Value Range Comment   Total CK 73  7 - 177 (U/L)    CK, MB 2.9  0.3 - 4.0 (ng/mL)    Relative Index RELATIVE INDEX IS INVALID  0.0 - 2.5    LIPASE, BLOOD     Status: Normal   Collection Time   06/08/11  5:09 PM      Component Value Range Comment   Lipase 58  11 - 59 (U/L)   COMPREHENSIVE METABOLIC PANEL     Status: Abnormal   Collection Time   06/08/11  5:09 PM      Component Value Range Comment   Sodium 137  135 - 145 (mEq/L)    Potassium 3.4 (*) 3.5 - 5.1 (mEq/L)    Chloride 104   96 - 112 (mEq/L)    CO2 24  19 - 32 (mEq/L)    Glucose, Bld 106 (*) 70 - 99 (mg/dL)    BUN 17  6 - 23 (mg/dL)    Creatinine, Ser 9.14  0.50 - 1.10 (mg/dL) ICTERUS AT THIS LEVEL MAY AFFECT RESULT   Calcium 9.5  8.4 - 10.5 (mg/dL)    Total Protein 7.3  6.0 - 8.3 (g/dL)  Albumin 4.3  3.5 - 5.2 (g/dL)    AST 13  0 - 37 (U/L)    ALT 16  0 - 35 (U/L)    Alkaline Phosphatase 105  39 - 117 (U/L)    Total Bilirubin 2.0 (*) 0.3 - 1.2 (mg/dL)    GFR calc non Af Amer >60  >60 (mL/min)    GFR calc Af Amer >60  >60 (mL/min)   D-DIMER, QUANTITATIVE     Status: Abnormal   Collection Time   06/08/11  5:09 PM      Component Value Range Comment   D-Dimer, Quant 0.53 (*) 0.00 - 0.48 (ug/mL-FEU)   URINALYSIS, ROUTINE W REFLEX MICROSCOPIC     Status: Abnormal   Collection Time   06/08/11  7:19 PM      Component Value Range Comment   Color, Urine AMBER (*) YELLOW  BIOCHEMICALS MAY BE AFFECTED BY COLOR   Appearance CLEAR  CLEAR     Specific Gravity, Urine 1.020  1.005 - 1.030     pH 6.0  5.0 - 8.0     Glucose, UA NEGATIVE  NEGATIVE (mg/dL)    Hgb urine dipstick NEGATIVE  NEGATIVE     Bilirubin Urine NEGATIVE  NEGATIVE     Ketones, ur NEGATIVE  NEGATIVE (mg/dL)    Protein, ur NEGATIVE  NEGATIVE (mg/dL)    Urobilinogen, UA 0.2  0.0 - 1.0 (mg/dL)    Nitrite NEGATIVE  NEGATIVE     Leukocytes, UA NEGATIVE  NEGATIVE  MICROSCOPIC NOT DONE ON URINES WITH NEGATIVE PROTEIN, BLOOD, LEUKOCYTES, NITRITE, OR GLUCOSE <1000 mg/dL.  TROPONIN I     Status: Normal   Collection Time   06/08/11  7:43 PM      Component Value Range Comment   Troponin I <0.30  <0.30 (ng/mL)    Dg Chest 2 View  06/08/2011  *RADIOLOGY REPORT*  Clinical Data: Chest pain started today.  Shortness of breath. History of DVT.  CHEST - 2 VIEW  Comparison: None.  Findings: Heart size is normal.  The lungs are free of focal consolidations and pleural effusions.  No pulmonary edema. Surgical clips are present in the right upper quadrant of the abdomen.   Prior cervical fusion. Visualized osseous structures have a normal appearance.  IMPRESSION: Negative exam.  Original Report Authenticated By: Patterson Hammersmith, M.D.   There are 3 EKGs available for [place]. The first one was done at 1538 hrs. The second one was done, at 1938 hrs. and the third one was done, at 2255 hrs.  EKG shows sinus rhythm at 89, normal axis. Intervals appear to be in the normal range. No definite Q waves are present. There is T. inversion in lead V1, V2 flattening in V3 on the first EKG. Similar findings in the second and 30 daily. There is some subtle ST segment changes in V5 beats also in V3. No ST elevations noted. We'll have any old EKGs for comparison.   Assessment/Plan  Principal Problem:  *Chest pain Active Problems:  Depression  Back pain, chronic   #1 chest pain: The Deanna Brandt does not have any risk factors for cardiac etiology. We will need to rule her out for acute coronary syndrome considering her subtle EKG findings. We will put her on nitro paste. We're unable to give her aspirin because of her anaphylactic reaction to the same. She was given Plavix in the emergency room. Depending on what her workup reveals antiplatelet agents will have to be considered in the  morning. I will go ahead and consult cardiology as well. Because the d-dimer is mildly elevated and, because she has a history of DVT. I will proceed with a VQ scan as Deanna Brandt does have CT dye allergy. EKG will be repeated in the morning. A lipid panel will also be checked.  #2 abdominal pain: Lipase is negative. LFTs are normal. I think the pain is secondary to her nausea and vomiting. We'll monitor this closely. No imaging studies at this time.  #3 for anemia recheck anemia panel.  #4 history of depression. Continue with the current medications.  #5 chronic back pain. Stable.  DVT, prophylaxis will be initiated.  Further management decisions will depend on results of further testing and Deanna Brandt's  response to treatment.  Verdell Kincannon 06/08/2011, 11:06 PM

## 2011-06-08 NOTE — ED Notes (Addendum)
C/o left chest pain onset 1 1/2 hrs ago while visiting her daughter in ICU, pain radiates down to left shoulder,describes as constant and rates 10 on 1-10 scale, headache also with nausea and vomiting onset about 30 minutes after chest pain began--has vomited two times.  Skin warm and dry, SR on monitor rate 90, p.o. 100% oon room air  B/p 142/88  Onset of chest pain began after she saw her daughter have a seizure.  States she does have an anxiety disorder and takes Xanax--

## 2011-06-08 NOTE — ED Notes (Signed)
Pt c/o chest pain with vomiting, headache, and dizziness x 1 hours.

## 2011-06-08 NOTE — ED Notes (Signed)
Resting on carrier---when I entered the room-she began c/o nausea---although she does not appear to be in any distress---advises she is allergic to Zofran and would have to have phenergan

## 2011-06-08 NOTE — ED Notes (Signed)
Pt ambulated to restroom at this time pt was a little dizzy but no syncope . Misty Stanley

## 2011-06-08 NOTE — ED Notes (Signed)
Meds given as ordered---B/P 145/94  HR 107

## 2011-06-09 ENCOUNTER — Encounter (HOSPITAL_COMMUNITY): Payer: Self-pay

## 2011-06-09 DIAGNOSIS — R079 Chest pain, unspecified: Secondary | ICD-10-CM

## 2011-06-09 LAB — IRON AND TIBC
Saturation Ratios: 15 % — ABNORMAL LOW (ref 20–55)
TIBC: 355 ug/dL (ref 250–470)

## 2011-06-09 LAB — COMPREHENSIVE METABOLIC PANEL
ALT: 14 U/L (ref 0–35)
AST: 13 U/L (ref 0–37)
Calcium: 9.3 mg/dL (ref 8.4–10.5)
Creatinine, Ser: 0.7 mg/dL (ref 0.50–1.10)
Sodium: 142 mEq/L (ref 135–145)
Total Protein: 6.8 g/dL (ref 6.0–8.3)

## 2011-06-09 LAB — CBC
HCT: 34.8 % — ABNORMAL LOW (ref 36.0–46.0)
Hemoglobin: 11.5 g/dL — ABNORMAL LOW (ref 12.0–15.0)
MCH: 28.7 pg (ref 26.0–34.0)
MCHC: 33 g/dL (ref 30.0–36.0)
RBC: 4.01 MIL/uL (ref 3.87–5.11)

## 2011-06-09 LAB — VITAMIN B12: Vitamin B-12: 381 pg/mL (ref 211–911)

## 2011-06-09 LAB — RETICULOCYTES: RBC.: 4.09 MIL/uL (ref 3.87–5.11)

## 2011-06-09 LAB — FOLATE: Folate: 19.9 ng/mL

## 2011-06-09 LAB — LIPID PANEL
Cholesterol: 61 mg/dL (ref 0–200)
LDL Cholesterol: 30 mg/dL (ref 0–99)
Total CHOL/HDL Ratio: 2.9 RATIO
Triglycerides: 49 mg/dL (ref <150)
VLDL: 10 mg/dL (ref 0–40)

## 2011-06-09 LAB — PROTIME-INR
INR: 1.46 (ref 0.00–1.49)
Prothrombin Time: 18 seconds — ABNORMAL HIGH (ref 11.6–15.2)

## 2011-06-09 LAB — CARDIAC PANEL(CRET KIN+CKTOT+MB+TROPI)
CK, MB: 3.2 ng/mL (ref 0.3–4.0)
Troponin I: <0.3 ng/mL (ref <0.30)
Troponin I: <0.3 ng/mL (ref <0.30)

## 2011-06-09 LAB — APTT: aPTT: 36 seconds (ref 24–37)

## 2011-06-09 MED ORDER — TECHNETIUM TO 99M ALBUMIN AGGREGATED
6.0000 | Freq: Once | INTRAVENOUS | Status: AC | PRN
  Administered 2011-06-09: 5.5 via INTRAVENOUS

## 2011-06-09 MED ORDER — ALPRAZOLAM 1 MG PO TABS
1.0000 mg | ORAL_TABLET | Freq: Three times a day (TID) | ORAL | Status: DC | PRN
  Administered 2011-06-09: 1 mg via ORAL
  Filled 2011-06-09: qty 1

## 2011-06-09 MED ORDER — SODIUM CHLORIDE 0.9 % IJ SOLN
3.0000 mL | Freq: Two times a day (BID) | INTRAMUSCULAR | Status: DC
  Administered 2011-06-09: 3 mL via INTRAVENOUS
  Filled 2011-06-09: qty 3

## 2011-06-09 MED ORDER — POTASSIUM CHLORIDE CRYS ER 20 MEQ PO TBCR
40.0000 meq | EXTENDED_RELEASE_TABLET | Freq: Once | ORAL | Status: AC
  Administered 2011-06-09: 40 meq via ORAL
  Filled 2011-06-09: qty 2

## 2011-06-09 MED ORDER — ENOXAPARIN SODIUM 40 MG/0.4ML ~~LOC~~ SOLN
40.0000 mg | Freq: Every day | SUBCUTANEOUS | Status: DC
  Administered 2011-06-09: 40 mg via SUBCUTANEOUS
  Filled 2011-06-09 (×2): qty 0.4

## 2011-06-09 MED ORDER — XENON XE 133 GAS
10.0000 | GAS_FOR_INHALATION | Freq: Once | RESPIRATORY_TRACT | Status: AC | PRN
  Administered 2011-06-09: 21.6 via RESPIRATORY_TRACT

## 2011-06-09 MED ORDER — TIZANIDINE HCL 2 MG PO TABS
4.0000 mg | ORAL_TABLET | Freq: Three times a day (TID) | ORAL | Status: DC | PRN
  Filled 2011-06-09: qty 2

## 2011-06-09 MED ORDER — ZOLPIDEM TARTRATE 5 MG PO TABS
10.0000 mg | ORAL_TABLET | Freq: Every evening | ORAL | Status: DC | PRN
  Administered 2011-06-09: 10 mg via ORAL
  Filled 2011-06-09: qty 2

## 2011-06-09 MED ORDER — PROMETHAZINE HCL 25 MG/ML IJ SOLN
12.5000 mg | Freq: Four times a day (QID) | INTRAMUSCULAR | Status: DC | PRN
  Administered 2011-06-09 (×2): 12.5 mg via INTRAVENOUS
  Filled 2011-06-09 (×2): qty 1

## 2011-06-09 MED ORDER — SENNA 8.6 MG PO TABS: 2.0000 | ORAL_TABLET | Freq: Every day | ORAL | Status: DC | PRN

## 2011-06-09 MED ORDER — ALPRAZOLAM 0.5 MG PO TABS: 0.5000 mg | ORAL_TABLET | Freq: Three times a day (TID) | ORAL | Status: DC | PRN

## 2011-06-09 MED ORDER — SODIUM CHLORIDE 0.9 % IJ SOLN: 3.0000 mL | INTRAMUSCULAR | Status: DC | PRN

## 2011-06-09 MED ORDER — ZOLPIDEM TARTRATE 5 MG PO TABS: 5.0000 mg | ORAL_TABLET | Freq: Every evening | ORAL | Status: DC | PRN

## 2011-06-09 MED ORDER — SODIUM CHLORIDE 0.9 % IV SOLN
250.0000 mL | INTRAVENOUS | Status: DC
  Administered 2011-06-09: 250 mL via INTRAVENOUS

## 2011-06-09 MED ORDER — MORPHINE SULFATE 2 MG/ML IJ SOLN
2.0000 mg | INTRAMUSCULAR | Status: DC | PRN
  Administered 2011-06-09 (×3): 2 mg via INTRAVENOUS
  Filled 2011-06-09 (×3): qty 1

## 2011-06-09 MED ORDER — PANTOPRAZOLE SODIUM 40 MG PO TBEC
40.0000 mg | DELAYED_RELEASE_TABLET | Freq: Every day | ORAL | Status: DC
  Administered 2011-06-09: 40 mg via ORAL
  Filled 2011-06-09: qty 1

## 2011-06-09 MED ORDER — PROMETHAZINE HCL 12.5 MG PO TABS
12.5000 mg | ORAL_TABLET | Freq: Four times a day (QID) | ORAL | Status: DC | PRN
  Administered 2011-06-09: 12.5 mg via ORAL
  Filled 2011-06-09: qty 1

## 2011-06-09 MED ORDER — OXYCODONE HCL 5 MG PO TABS: 5.0000 mg | ORAL_TABLET | ORAL | Status: DC | PRN

## 2011-06-09 MED ORDER — MORPHINE SULFATE 2 MG/ML IJ SOLN: 1.0000 mg | INTRAMUSCULAR | Status: DC | PRN

## 2011-06-09 MED ORDER — HYDROCODONE-ACETAMINOPHEN 5-325 MG PO TABS
1.0000 | ORAL_TABLET | Freq: Four times a day (QID) | ORAL | Status: DC | PRN
  Administered 2011-06-09: 1 via ORAL
  Filled 2011-06-09: qty 1

## 2011-06-09 MED ORDER — ZIPRASIDONE HCL 60 MG PO CAPS
60.0000 mg | ORAL_CAPSULE | Freq: Every day | ORAL | Status: DC
  Administered 2011-06-09: 60 mg via ORAL
  Filled 2011-06-09 (×3): qty 1

## 2011-06-09 MED ORDER — ALBUTEROL SULFATE (5 MG/ML) 0.5% IN NEBU: 2.5000 mg | INHALATION_SOLUTION | RESPIRATORY_TRACT | Status: DC | PRN

## 2011-06-09 MED ORDER — ACETAMINOPHEN 650 MG RE SUPP: 650.0000 mg | Freq: Four times a day (QID) | RECTAL | Status: DC | PRN

## 2011-06-09 MED ORDER — SODIUM CHLORIDE 0.9 % IJ SOLN
INTRAMUSCULAR | Status: AC
  Administered 2011-06-09: 08:00:00
  Filled 2011-06-09: qty 10

## 2011-06-09 MED ORDER — NITROGLYCERIN 2 % TD OINT
0.5000 [in_us] | TOPICAL_OINTMENT | Freq: Four times a day (QID) | TRANSDERMAL | Status: DC
  Administered 2011-06-09 (×3): 0.5 [in_us] via TOPICAL
  Filled 2011-06-09 (×3): qty 1

## 2011-06-09 MED ORDER — ACETAMINOPHEN 325 MG PO TABS
650.0000 mg | ORAL_TABLET | Freq: Four times a day (QID) | ORAL | Status: DC | PRN
  Administered 2011-06-09: 650 mg via ORAL
  Filled 2011-06-09: qty 2

## 2011-06-09 MED ORDER — SODIUM CHLORIDE 0.9 % IJ SOLN
INTRAMUSCULAR | Status: AC
  Filled 2011-06-09: qty 10

## 2011-06-09 NOTE — Progress Notes (Signed)
UR Chart Review Completed  

## 2011-06-09 NOTE — Progress Notes (Signed)
Chart reviewed. Per nursing, the patient has been asking for IV Phenergan, pain medications and Xanax. She has been tolerating a diet however.  Subjective: Still complaining of chest pain. Objective: Vital signs in last 24 hours: Filed Vitals:   06/09/11 0436 06/09/11 0508 06/09/11 0911 06/09/11 1426  BP:  138/94  112/80  Pulse:  100  106  Temp:  98.2 F (36.8 C)  98 F (36.7 C)  TempSrc:  Oral    Resp: 18 16 16 18   Height:      Weight:      SpO2:  94% 95% 97%   Weight change:   Intake/Output Summary (Last 24 hours) at 06/09/11 1450 Last data filed at 06/09/11 0900  Gross per 24 hour  Intake    613 ml  Output      0 ml  Net    613 ml   General: The patient is asleep. She arouses but falls quickly back to sleep. She is unable to focus or track. She will answer questions. Lungs clear to auscultation bilaterally without wheeze rhonchi or rales Cardiovascular regular rate rhythm without murmurs gallops or rubs Chest wall no tenderness Abdomen soft nontender nondistended Extremities no clubbing cyanosis or edema  Lab Results: Basic Metabolic Panel:  Lab 06/09/11 9147 06/08/11 1709  NA 142 137  K 3.7 3.4*  CL 110 104  CO2 24 24  GLUCOSE 89 106*  BUN 10 17  CREATININE 0.70 0.78  CALCIUM 9.3 9.5  MG -- --  PHOS -- --   Liver Function Tests:  Lab 06/09/11 0557 06/08/11 1709  AST 13 13  ALT 14 16  ALKPHOS 97 105  BILITOT 2.0* 2.0*  PROT 6.8 7.3  ALBUMIN 3.9 4.3    Lab 06/08/11 1709  LIPASE 58  AMYLASE --   No results found for this basename: AMMONIA:2 in the last 168 hours CBC:  Lab 06/09/11 0557 06/08/11 1709  WBC 5.6 6.8  NEUTROABS -- --  HGB 11.5* 11.7*  HCT 34.8* 34.9*  MCV 86.8 85.3  PLT 224 218   Cardiac Enzymes:  Lab 06/09/11 1336 06/09/11 0558 06/08/11 1943 06/08/11 1709  CKTOTAL 79 82 -- 73  CKMB 3.2 3.2 -- 2.9  CKMBINDEX -- -- -- --  TROPONINI <0.30 <0.30 <0.30 --   BNP:  Lab 06/09/11 0130  POCBNP 15.6   D-Dimer:  Lab 06/08/11  1709  DDIMER 0.53*   CBG: No results found for this basename: GLUCAP:6 in the last 168 hours Hemoglobin A1C: No results found for this basename: HGBA1C in the last 168 hours Fasting Lipid Panel:  Lab 06/09/11 0605  CHOL 61  HDL 21*  LDLCALC 30  TRIG 49  CHOLHDL 2.9  LDLDIRECT --   Thyroid Function Tests:  Lab 06/09/11 0130  TSH 0.623  T4TOTAL --  FREET4 --  T3FREE --  THYROIDAB --   Anemia Panel:  Lab 06/09/11 0130  VITAMINB12 381  FOLATE --  FERRITIN 34  TIBC 355  IRON 54  RETICCTPCT 1.1   Urine Drug Screen:  Alcohol Level: No results found for this basename: ETH:2 in the last 168 hours   Micro Results: No results found for this or any previous visit (from the past 240 hour(s)). Studies/Results: Dg Chest 2 View  06/08/2011  *RADIOLOGY REPORT*  Clinical Data: Chest pain started today.  Shortness of breath. History of DVT.  CHEST - 2 VIEW  Comparison: None.  Findings: Heart size is normal.  The lungs are free of focal  consolidations and pleural effusions.  No pulmonary edema. Surgical clips are present in the right upper quadrant of the abdomen.  Prior cervical fusion. Visualized osseous structures have a normal appearance.  IMPRESSION: Negative exam.  Original Report Authenticated By: Patterson Hammersmith, M.D.   Nm Pulmonary Per & Vent  06/09/2011  *RADIOLOGY REPORT*  Clinical Data:  Chest pain.  History of blood clots in the legs.  NUCLEAR MEDICINE VENTILATION - PERFUSION LUNG SCAN  Technique:  Wash-in, equilibrium, and wash-out phase ventilation images were obtained using Xe-133 gas.  Perfusion images were obtained in multiple projections after intravenous injection of Tc- 64m MAA.  Radiopharmaceuticals:  21.6 mCi Xe-133 gas and 5.58 mCi Tc-52m MAA.  Comparison:  Chest x-ray 06/08/2011  Findings: The ventilation scan is normal.  No air trapping. The perfusion scan is normal.  No pleural based wedge-shaped defects to indicate presence of pulmonary emboli.  IMPRESSION:   Normal VQ scan.  Original Report Authenticated By: Patterson Hammersmith, M.D.   Scheduled Meds:   . clopidogrel  150 mg Oral Once  . enoxaparin  40 mg Subcutaneous Daily  . LORazepam  1 mg Intravenous Once  . morphine      . morphine      .  morphine injection  4 mg Intravenous Once  . nitroGLYCERIN  0.4 mg Sublingual Once  . pantoprazole  40 mg Oral Q1200  . potassium chloride  40 mEq Oral Once  . sodium chloride  3 mL Intravenous Q12H  . sodium chloride      . sodium chloride      . ziprasidone  60 mg Oral Daily  . DISCONTD: nitroGLYCERIN  0.5 inch Topical Q6H   Continuous Infusions:   . sodium chloride 250 mL (06/09/11 0132)   EKG shows normal sinus rhythm with nonspecific changes.  Assessment/Plan: Principal Problem:  *Chest pain Active Problems:  Depression  Back pain, chronic  Patient has ruled out for MI and PE. Her EKG today looks okay. I cannot find yesterday's EKG. Dr. Dietrich Pates has been consulted. Patient is completely oversedated and I will scale back all her sedating medications.   LOS: 1 day   Deanna Brandt 06/09/2011, 2:50 PM

## 2011-06-09 NOTE — Plan of Care (Signed)
Problem: Phase I Progression Outcomes Goal: Aspirin unless contraindicated Outcome: Not Applicable Date Met:  06/09/11 Pt has asa allergy

## 2011-06-09 NOTE — Progress Notes (Signed)
Pt discharged home today per Dr. Lendell Caprice. Pt's IV site D/C'd and WNL. Pt's VS stable at this time. Pt provided with home medication list and discharge instructions. Verbalized understanding. Pt left floor via WC accompanied by NT in stable condition. Deanna Ligas, RN

## 2011-06-09 NOTE — Progress Notes (Signed)
Reason for Consult:: Chest pain Referring Physician: Dr. Lendell Caprice  HPI: Deanna Brandt is an 50 y.o. female with no history of cardiac disease, admitted to hospital for chest pain.  She noted the sudden onset of mid substernal pressure with some radiation towards the left shoulder associated with nausea and emesis.  There was no dyspnea nor diaphoresis.  Discomfort has persisted throughout her brief hospitalization, but has gradually improved.  Cardiac markers and EKGs have been negative.  Ms. Deanna Brandt did have an episode with similar symptoms when she presented with a deep vein thrombosis.  She is unaware whether pulmonary embolism was present at that time as well.  Her lifestyle is generally active without any exercise-related symptoms.  She has not been told of diabetes, hypertension or hyperlipidemia.  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  She has no significant family history for vascular disease.  Past Medical History  Diagnosis Date  . Depression   . Anxiety   . Migraine   . DVT (deep venous thrombosis)     Past Surgical History  Procedure Date  . Back surgery   . Cholecystectomy   . Appendectomy   . Abdominal hysterectomy     Family history: No first degree relatives with coronary artery disease or vascular disease.  No family history of thromboembolic disorders.  Social History:  reports that she has never smoked. She does not have any smokeless tobacco history on file. She reports that she does not drink alcohol or use illicit drugs.  Review of Systems Patient reports chronic back pain.  She is troubled by chronic anxiety.  She developed ecchymoses with minimal trauma.  All other systems reviewed and are negative.  Allergies:  Allergies  Allergen Reactions  . Aspirin Anaphylaxis  . Bee Venom Anaphylaxis  . Compazine Hives  . Ibuprofen Nausea And Vomiting  . Zofran Hives   . Ivp Dye (Iodinated Diagnostic Agents) Rash    Medications:  Current Facility-Administered Medications  Medication Dose Route Frequency Provider Last Rate Last Dose  . 0.9 %  sodium chloride infusion  250 mL Intravenous Continuous Gokul Krishnan 1 mL/hr at 06/09/11 0132 250 mL at 06/09/11 0132  . acetaminophen (TYLENOL) tablet 650 mg  650 mg Oral Q6H PRN Gokul Krishnan   650 mg at 06/09/11 0809  . albuterol (PROVENTIL) (5 MG/ML) 0.5% nebulizer solution 2.5 mg  2.5 mg Nebulization Q2H PRN Osvaldo Shipper      . ALPRAZolam Prudy Feeler) tablet 0.5 mg  0.5 mg Oral TID PRN Corinna L Sullivan      . enoxaparin (LOVENOX) injection 40 mg  40 mg Subcutaneous Daily Gokul Krishnan   40 mg at 06/09/11 1034  . HYDROcodone-acetaminophen (NORCO) 5-325 MG per tablet 1 tablet  1 tablet Oral Q6H PRN Christiane Ha   1 tablet at 06/09/11 1628  . morphine 10 MG/ML injection        6 mg at 06/08/11 2121  . pantoprazole (PROTONIX) EC tablet 40 mg  40 mg Oral Q1200 Gokul Krishnan   40 mg at 06/09/11 1200  . potassium chloride SA (K-DUR,KLOR-CON) CR tablet 40 mEq  40 mEq Oral Once Gokul Krishnan   40 mEq at 06/09/11 0140  . promethazine (PHENERGAN) tablet 12.5 mg  12.5 mg Oral Q6H PRN Corinna L Sullivan   12.5 mg at 06/09/11 1628  . senna (SENOKOT) tablet 17.2 mg  2 tablet Oral Daily PRN Gokul  Rito Ehrlich      . sodium chloride 0.9 % injection 3 mL  3 mL Intravenous Q12H Gokul Krishnan   3 mL at 06/09/11 0134  . sodium chloride 0.9 % injection 3 mL  3 mL Intravenous PRN Osvaldo Shipper      . sodium chloride 0.9 % injection           . sodium chloride 0.9 % injection           . technetium albumin aggregated (MAA) injection solution 6 milli Curie  6 milli Curie Intravenous Once PRN Medication Radiologist   5.5 milli Curie at 06/09/11 0015  . tiZANidine (ZANAFLEX) tablet 4 mg  4 mg Oral TID PRN Osvaldo Shipper      . xenon xe 133 gas 10 milli Curie  10 milli Curie Inhalation Once PRN Medication Radiologist   21.6 milli  Curie at 06/09/11 0010  . ziprasidone (GEODON) capsule 60 mg  60 mg Oral Daily Gokul Krishnan   60 mg at 06/09/11 1034  . zolpidem (AMBIEN) tablet 5 mg  5 mg Oral QHS PRN Christiane Ha      . DISCONTD: acetaminophen (TYLENOL) suppository 650 mg  650 mg Rectal Q6H PRN Osvaldo Shipper      . DISCONTD: ALPRAZolam Prudy Feeler) tablet 1 mg  1 mg Oral TID PRN Gokul Krishnan   1 mg at 06/09/11 1201  . DISCONTD: morphine injection 1 mg  1 mg Intravenous Q4H PRN Christiane Ha      . DISCONTD: morphine injection 2 mg  2 mg Intravenous Q4H PRN Gokul Krishnan   2 mg at 06/09/11 1035  . DISCONTD: nitroGLYCERIN (NITROGLYN) 2 % ointment 0.5 inch  0.5 inch Topical Q6H Gokul Krishnan   0.5 inch at 06/09/11 1200  . DISCONTD: oxyCODONE (Oxy IR/ROXICODONE) immediate release tablet 5 mg  5 mg Oral Q4H PRN Osvaldo Shipper      . DISCONTD: promethazine (PHENERGAN) injection 12.5 mg  12.5 mg Intravenous Q6H PRN Gokul Krishnan   12.5 mg at 06/09/11 0807  . DISCONTD: promethazine (PHENERGAN) injection 25 mg  25 mg Intravenous Q6H PRN Lyanne Co, MD   25 mg at 06/08/11 1923  . DISCONTD: zolpidem (AMBIEN) tablet 10 mg  10 mg Oral QHS PRN Gokul Krishnan   10 mg at 06/09/11 0140   Current Outpatient Prescriptions  Medication Sig Dispense Refill  . ALPRAZolam (XANAX) 1 MG tablet Take 1 mg by mouth 3 (three) times daily as needed. For anxiety        . tizanidine (ZANAFLEX) 2 MG capsule Take 4 mg by mouth 3 (three) times daily as needed. For muscle spasms       . ziprasidone (GEODON) 60 MG capsule Take 60 mg by mouth daily.        Marland Kitchen zolpidem (AMBIEN) 10 MG tablet Take 10 mg by mouth at bedtime as needed. For sleep        Physical Examination BP 112/80  Pulse 106  Temp(Src) 98 F (36.7 C) (Oral)  Resp 18  Ht 5\' 5"  (1.651 m)  Wt 75.388 kg (166 lb 3.2 oz)  BMI 27.66 kg/m2  SpO2 97% General-Well-developed; no acute distress Body Habitus-mildly overweight HEENT-[place]/AT; PERRL; EOM intact; conjunctiva and lids  nl Neck-No JVD; no carotid bruits Endocrine-No thyromegaly Lungs-Clear lung fields; resonant percussion; normal I-to-E ratio Cardiovascular- normal PMI; normal S1 and S2; S4 present Abdomen-BS normal; soft and non-tender without masses or organomegaly Musculoskeletal-No deformities, cyanosis or clubbing Neurologic-Nl cranial nerves; symmetric  strength and tone Skin- Warm, no significant lesions Extremities-Nl distal pulses; no edema  Results for orders placed during the hospital encounter of 06/08/11 (from the past 48 hour(s))  CBC     Status: Abnormal   Collection Time   06/08/11  5:09 PM      Component Value Range Comment   WBC 6.8  4.0 - 10.5 (K/uL)    RBC 4.09  3.87 - 5.11 (MIL/uL)    Hemoglobin 11.7 (*) 12.0 - 15.0 (g/dL)    HCT 62.9 (*) 52.8 - 46.0 (%)    MCV 85.3  78.0 - 100.0 (fL)    MCH 28.6  26.0 - 34.0 (pg)    MCHC 33.5  30.0 - 36.0 (g/dL)    RDW 41.3  24.4 - 01.0 (%)    Platelets 218  150 - 400 (K/uL)   TROPONIN I     Status: Normal   Collection Time   06/08/11  5:09 PM      Component Value Range Comment   Troponin I <0.30  <0.30 (ng/mL)   CK TOTAL AND CKMB     Status: Normal   Collection Time   06/08/11  5:09 PM      Component Value Range Comment   Total CK 73  7 - 177 (U/L)    CK, MB 2.9  0.3 - 4.0 (ng/mL)    Relative Index RELATIVE INDEX IS INVALID  0.0 - 2.5    LIPASE, BLOOD     Status: Normal   Collection Time   06/08/11  5:09 PM      Component Value Range Comment   Lipase 58  11 - 59 (U/L)   COMPREHENSIVE METABOLIC PANEL     Status: Abnormal   Collection Time   06/08/11  5:09 PM      Component Value Range Comment   Sodium 137  135 - 145 (mEq/L)    Potassium 3.4 (*) 3.5 - 5.1 (mEq/L)    Chloride 104  96 - 112 (mEq/L)    CO2 24  19 - 32 (mEq/L)    Glucose, Bld 106 (*) 70 - 99 (mg/dL)    BUN 17  6 - 23 (mg/dL)    Creatinine, Ser 2.72  0.50 - 1.10 (mg/dL) ICTERUS AT THIS LEVEL MAY AFFECT RESULT   Calcium 9.5  8.4 - 10.5 (mg/dL)    Total Protein 7.3  6.0 - 8.3  (g/dL)    Albumin 4.3  3.5 - 5.2 (g/dL)    AST 13  0 - 37 (U/L)    ALT 16  0 - 35 (U/L)    Alkaline Phosphatase 105  39 - 117 (U/L)    Total Bilirubin 2.0 (*) 0.3 - 1.2 (mg/dL)    GFR calc non Af Amer >60  >60 (mL/min)    GFR calc Af Amer >60  >60 (mL/min)   D-DIMER, QUANTITATIVE     Status: Abnormal   Collection Time   06/08/11  5:09 PM      Component Value Range Comment   D-Dimer, Quant 0.53 (*) 0.00 - 0.48 (ug/mL-FEU)   URINALYSIS, ROUTINE W REFLEX MICROSCOPIC     Status: Abnormal   Collection Time   06/08/11  7:19 PM      Component Value Range Comment   Color, Urine AMBER (*) YELLOW  BIOCHEMICALS MAY BE AFFECTED BY COLOR   Appearance CLEAR  CLEAR     Specific Gravity, Urine 1.020  1.005 - 1.030     pH 6.0  5.0 - 8.0     Glucose, UA NEGATIVE  NEGATIVE (mg/dL)    Hgb urine dipstick NEGATIVE  NEGATIVE     Bilirubin Urine NEGATIVE  NEGATIVE     Ketones, ur NEGATIVE  NEGATIVE (mg/dL)    Protein, ur NEGATIVE  NEGATIVE (mg/dL)    Urobilinogen, UA 0.2  0.0 - 1.0 (mg/dL)    Nitrite NEGATIVE  NEGATIVE     Leukocytes, UA NEGATIVE  NEGATIVE  MICROSCOPIC NOT DONE ON URINES WITH NEGATIVE PROTEIN, BLOOD, LEUKOCYTES, NITRITE, OR GLUCOSE <1000 mg/dL.  TROPONIN I     Status: Normal   Collection Time   06/08/11  7:43 PM      Component Value Range Comment   Troponin I <0.30  <0.30 (ng/mL)   PRO B NATRIURETIC PEPTIDE     Status: Normal   Collection Time   06/09/11  1:30 AM      Component Value Range Comment   BNP, POC 15.6  0 - 125 (pg/mL)   TSH     Status: Normal   Collection Time   06/09/11  1:30 AM      Component Value Range Comment   TSH 0.623  0.350 - 4.500 (uIU/mL)   VITAMIN B12     Status: Normal   Collection Time   06/09/11  1:30 AM      Component Value Range Comment   Vitamin B-12 381  211 - 911 (pg/mL)   FOLATE     Status: Normal   Collection Time   06/09/11  1:30 AM      Component Value Range Comment   Folate 19.9     IRON AND TIBC     Status: Abnormal   Collection Time   06/09/11   1:30 AM      Component Value Range Comment   Iron 54  42 - 135 (ug/dL)    TIBC 578  469 - 629 (ug/dL)    Saturation Ratios 15 (*) 20 - 55 (%)    UIBC 301  125 - 400 (ug/dL)   FERRITIN     Status: Normal   Collection Time   06/09/11  1:30 AM      Component Value Range Comment   Ferritin 34  10 - 291 (ng/mL)   RETICULOCYTES     Status: Normal   Collection Time   06/09/11  1:30 AM      Component Value Range Comment   Retic Ct Pct 1.1  0.4 - 3.1 (%)    RBC. 4.09  3.87 - 5.11 (MIL/uL)    Retic Count, Manual 45.0  19.0 - 186.0 (K/uL)   PROTIME-INR     Status: Abnormal   Collection Time   06/09/11  1:30 AM      Component Value Range Comment   Prothrombin Time 18.0 (*) 11.6 - 15.2 (seconds)    INR 1.46  0.00 - 1.49    APTT     Status: Normal   Collection Time   06/09/11  1:30 AM      Component Value Range Comment   aPTT 36  24 - 37 (seconds)   COMPREHENSIVE METABOLIC PANEL     Status: Abnormal   Collection Time   06/09/11  5:57 AM      Component Value Range Comment   Sodium 142  135 - 145 (mEq/L)    Potassium 3.7  3.5 - 5.1 (mEq/L)    Chloride 110  96 - 112 (mEq/L)    CO2 24  19 -  32 (mEq/L)    Glucose, Bld 89  70 - 99 (mg/dL)    BUN 10  6 - 23 (mg/dL)    Creatinine, Ser 2.95  0.50 - 1.10 (mg/dL)    Calcium 9.3  8.4 - 10.5 (mg/dL)    Total Protein 6.8  6.0 - 8.3 (g/dL)    Albumin 3.9  3.5 - 5.2 (g/dL)    AST 13  0 - 37 (U/L)    ALT 14  0 - 35 (U/L)    Alkaline Phosphatase 97  39 - 117 (U/L)    Total Bilirubin 2.0 (*) 0.3 - 1.2 (mg/dL)    GFR calc non Af Amer >60  >60 (mL/min)    GFR calc Af Amer >60  >60 (mL/min)   CBC     Status: Abnormal   Collection Time   06/09/11  5:57 AM      Component Value Range Comment   WBC 5.6  4.0 - 10.5 (K/uL)    RBC 4.01  3.87 - 5.11 (MIL/uL)    Hemoglobin 11.5 (*) 12.0 - 15.0 (g/dL)    HCT 28.4 (*) 13.2 - 46.0 (%)    MCV 86.8  78.0 - 100.0 (fL)    MCH 28.7  26.0 - 34.0 (pg)    MCHC 33.0  30.0 - 36.0 (g/dL)    RDW 44.0  10.2 - 72.5 (%)     Platelets 224  150 - 400 (K/uL)   CARDIAC PANEL(CRET KIN+CKTOT+MB+TROPI)     Status: Normal   Collection Time   06/09/11  5:58 AM      Component Value Range Comment   Total CK 82  7 - 177 (U/L)    CK, MB 3.2  0.3 - 4.0 (ng/mL)    Troponin I <0.30  <0.30 (ng/mL)    Relative Index RELATIVE INDEX IS INVALID  0.0 - 2.5    LIPID PANEL     Status: Abnormal   Collection Time   06/09/11  6:05 AM      Component Value Range Comment   Cholesterol 61  0 - 200 (mg/dL)    Triglycerides 49  <366 (mg/dL)    HDL 21 (*) >44 (mg/dL)    Total CHOL/HDL Ratio 2.9      VLDL 10  0 - 40 (mg/dL)    LDL Cholesterol 30  0 - 99 (mg/dL)   CARDIAC PANEL(CRET KIN+CKTOT+MB+TROPI)     Status: Normal   Collection Time   06/09/11  1:36 PM      Component Value Range Comment   Total CK 79  7 - 177 (U/L)    CK, MB 3.2  0.3 - 4.0 (ng/mL)    Troponin I <0.30  <0.30 (ng/mL)    Relative Index RELATIVE INDEX IS INVALID  0.0 - 2.5      Dg Chest 2 View  06/08/2011  *RADIOLOGY REPORT*  Clinical Data: Chest pain started today.  Shortness of breath. History of DVT.  CHEST - 2 VIEW  Comparison: None.  Findings: Heart size is normal.  The lungs are free of focal consolidations and pleural effusions.  No pulmonary edema. Surgical clips are present in the right upper quadrant of the abdomen.  Prior cervical fusion. Visualized osseous structures have a normal appearance.  IMPRESSION: Negative exam.  Original Report Authenticated By: Patterson Hammersmith, M.D.   Nm Pulmonary Per & Vent  06/09/2011  *RADIOLOGY REPORT*  Clinical Data:  Chest pain.  History of blood clots in the legs.  NUCLEAR  MEDICINE VENTILATION - PERFUSION LUNG SCAN  Technique:  Wash-in, equilibrium, and wash-out phase ventilation images were obtained using Xe-133 gas.  Perfusion images were obtained in multiple projections after intravenous injection of Tc- 45m MAA.  Radiopharmaceuticals:  21.6 mCi Xe-133 gas and 5.58 mCi Tc-60m MAA.  Comparison:  Chest x-ray 06/08/2011   Findings: The ventilation scan is normal.  No air trapping. The perfusion scan is normal.  No pleural based wedge-shaped defects to indicate presence of pulmonary emboli.  IMPRESSION:  Normal VQ scan.  Original Report Authenticated By: Patterson Hammersmith, M.D.   EKG:  Normal sinus rhythm; minor nonspecific ST segment abnormality.  Assessment/Plan: Patient presents with chest discomfort that has some characteristics of myocardial ischemia; however, exam, EKG, and cardiac markers are benign while risk factors are modest.  Patient is eager to be discharged from hospital and does not wish to stay overnight for a stress test in the morning.  One can be arranged to be performed as an outpatient within the next few days.  Patient is cautioned to return to the hospital immediately should symptoms recur.  Pulmonary embolism has been ruled out by a near normal d-dimer in conjunction with a perfectly normal VQ scan.  A GI etiology for her symptoms is most likely.  I will not plan routine cardiology followup unless her stress test is positive.  She has a mild anemia with negative iron studies.  This can be monitored and further investigated by her PCP.  Porter Bing 06/09/2011, 8:27 PM

## 2011-08-06 NOTE — Discharge Summary (Signed)
Physician Discharge Summary  Patient ID: Deanna Brandt MRN: [id number] DOB/AGE: 1961/10/02 50 y.o.  Admit date: 06/08/2011 Discharge date: 06/09/2011  Discharge Diagnoses:  Principal Problem:  *Chest pain Active Problems:  Depression  Back pain, chronic   Discharge Medication List as of 06/09/2011  6:22 PM    CONTINUE these medications which have NOT CHANGED   Details  ALPRAZolam (XANAX) 1 MG tablet Take 1 mg by mouth 3 (three) times daily as needed. For anxiety  , Until Discontinued, Historical Med    tizanidine (ZANAFLEX) 2 MG capsule Take 4 mg by mouth 3 (three) times daily as needed. For muscle spasms , Until Discontinued, Historical Med    ziprasidone (GEODON) 60 MG capsule Take 60 mg by mouth daily.  , Until Discontinued, Historical Med    zolpidem (AMBIEN) 10 MG tablet Take 10 mg by mouth at bedtime as needed. For sleep , Until Discontinued, Historical Med        Discharge Orders    Future Orders Please Complete By Expires   Diet general      Increase activity slowly         Follow-up Information    Make an appointment with Primary care provider. (4 stress test)          Disposition: Home or Self Care  Discharged Condition: Stable  Consults: Treatment Team:  Gerrit Friends. Rothbart, MD  Labs:   142      Potassium   3.7      Chloride   110      CO2   24      BUN   10      Creatinine, Ser   0.70      Calcium   9.3      GFR calc non Af Amer   60 mL/min">60      GFR calc Af Amer   60 mL/min  The eGFR has been calculated using the MDRD equation. This calculation has not been validated in all clinical situations. eGFR's persistently <60 mL/min signify possible Chronic Kidney Disease.">6060 mL/min  The eGFR has been calculated using the MDRD equation. This calculation has not been validated in all clinical situations. eGFR's persistently <60 mL/min signify possible Chronic Kidney Disease." border=0  src="[email or web address]:/[email or web address]%[email or web address]([email or web address]/EN-[email or web address]/[email or web address]" width=5 height=10      Glucose, Bld   89      Alkaline Phosphatase   97      Albumin   3.9      AST   13      ALT   14      Total Protein   6.8      Total Bilirubin   2.0       CARDIAC PROFILE    CK, MB    3.2  3.2   Total CK    82  79   Troponin I    <0.30   <0.30     LIPID PROFILE    Cholesterol     61    Triglycerides     49    HDL     21    LDL Cholesterol     30     VLDL     10    Total CHOL/HDL Ratio     2.9     IRON /ANEMIA PROFILE    Iron 54        UIBC 301        TIBC 355        Saturation Ratios  15        Ferritin 34        Folate 5.4 ng/mL"19.9  5.4 ng/mL" border=0 src="[email or web address]:/[email or web address]([email or web address]/[email or web address]" width=5 height=10         OTHER CHEM    Vitamin B-12 381         PROTEIN ELP    Total Protein   6.8       CBC    WBC   5.6      RBC   4.01      Hemoglobin   11.5      HCT   34.8      MCV   86.8      MCH   28.7      MCHC   33.0      RDW   14.3      Platelets   224       DIFFERENTIAL    RBC. 4.09        Retic Ct Pct 1.1        Retic Count, Manual 45.0         PROTIME W/ INR    Prothrombin Time 18.0        INR 1.46         PTT    aPTT 36         DIABETES    Glucose, Bld   89       THYROID    TSH 0.623         POC CHEMISTRY    BNP, POC 15.6       Diagnostics:  No results found. EKG: NSR with nonspecific changes    Hospital Course: Patient is a 50 year old white female who presented with chest pain.  See H&P for admission details.  Her physical exam, cardiac enzymes and EKG were unremarkable.  Dr. Dietrich Pates was consulted and cleared pateint for discharge after she ruled out for MI.    Discharge Exam: Blood pressure 112/80, pulse 106, temperature 98 F (36.7 C), temperature source Oral, resp. rate 18, height 5\' 5"  (1.651 m), weight 75.388 kg (166 lb 3.2 oz), SpO2 97.00%. More alert.  Otherwise unchanged from progress note earlier today   Signed: Sharanya Templin L 08/06/2011, 2:21 PM

## 2012-03-13 DIAGNOSIS — R079 Chest pain, unspecified: Secondary | ICD-10-CM

## 2012-06-10 ENCOUNTER — Emergency Department (HOSPITAL_COMMUNITY): Payer: Medicare Other

## 2012-06-10 ENCOUNTER — Other Ambulatory Visit: Payer: Self-pay

## 2012-06-10 ENCOUNTER — Emergency Department (HOSPITAL_COMMUNITY)
Admission: EM | Admit: 2012-06-10 | Discharge: 2012-06-10 | Disposition: A | Discharge status: Home / Self Care | Payer: Medicare Other | Attending: Emergency Medicine | Admitting: Emergency Medicine

## 2012-06-10 ENCOUNTER — Encounter (HOSPITAL_COMMUNITY): Payer: Self-pay | Admitting: Emergency Medicine

## 2012-06-10 DIAGNOSIS — I498 Other specified cardiac arrhythmias: Secondary | ICD-10-CM | POA: Insufficient documentation

## 2012-06-10 DIAGNOSIS — R079 Chest pain, unspecified: Secondary | ICD-10-CM | POA: Insufficient documentation

## 2012-06-10 DIAGNOSIS — R112 Nausea with vomiting, unspecified: Secondary | ICD-10-CM | POA: Insufficient documentation

## 2012-06-10 DIAGNOSIS — F329 Major depressive disorder, single episode, unspecified: Secondary | ICD-10-CM | POA: Insufficient documentation

## 2012-06-10 DIAGNOSIS — Z86718 Personal history of other venous thrombosis and embolism: Secondary | ICD-10-CM | POA: Insufficient documentation

## 2012-06-10 DIAGNOSIS — R51 Headache: Secondary | ICD-10-CM | POA: Insufficient documentation

## 2012-06-10 DIAGNOSIS — M79609 Pain in unspecified limb: Secondary | ICD-10-CM | POA: Insufficient documentation

## 2012-06-10 DIAGNOSIS — F411 Generalized anxiety disorder: Secondary | ICD-10-CM | POA: Insufficient documentation

## 2012-06-10 DIAGNOSIS — R0602 Shortness of breath: Secondary | ICD-10-CM | POA: Insufficient documentation

## 2012-06-10 DIAGNOSIS — Z86711 Personal history of pulmonary embolism: Secondary | ICD-10-CM | POA: Insufficient documentation

## 2012-06-10 DIAGNOSIS — F3289 Other specified depressive episodes: Secondary | ICD-10-CM | POA: Insufficient documentation

## 2012-06-10 LAB — CBC WITH DIFFERENTIAL/PLATELET
Hemoglobin: 12.4 g/dL (ref 12.0–15.0)
Lymphocytes Relative: 27 % (ref 12–46)
Lymphs Abs: 1.9 10*3/uL (ref 0.7–4.0)
MCH: 28.4 pg (ref 26.0–34.0)
Monocytes Relative: 4 % (ref 3–12)
Neutro Abs: 4.6 10*3/uL (ref 1.7–7.7)
Neutrophils Relative %: 67 % (ref 43–77)
Platelets: 188 10*3/uL (ref 150–400)
RBC: 4.36 MIL/uL (ref 3.87–5.11)
WBC: 6.9 10*3/uL (ref 4.0–10.5)

## 2012-06-10 LAB — TROPONIN I: Troponin I: <0.3 ng/mL (ref <0.30)

## 2012-06-10 MED ORDER — MORPHINE SULFATE 4 MG/ML IJ SOLN
4.0000 mg | Freq: Once | INTRAMUSCULAR | Status: AC
  Administered 2012-06-10: 4 mg via INTRAVENOUS

## 2012-06-10 MED ORDER — PROMETHAZINE HCL 25 MG/ML IJ SOLN
12.5000 mg | Freq: Once | INTRAMUSCULAR | Status: AC
  Administered 2012-06-10: 12.5 mg via INTRAVENOUS

## 2012-06-10 MED ORDER — NITROGLYCERIN 0.4 MG SL SUBL
0.4000 mg | SUBLINGUAL_TABLET | SUBLINGUAL | Status: DC | PRN
  Administered 2012-06-10 (×2): 0.4 mg via SUBLINGUAL
  Filled 2012-06-10: qty 25

## 2012-06-10 MED ORDER — MORPHINE SULFATE 4 MG/ML IJ SOLN
INTRAMUSCULAR | Status: AC
  Administered 2012-06-10: 4 mg via INTRAVENOUS
  Filled 2012-06-10: qty 1

## 2012-06-10 MED ORDER — ACETAMINOPHEN 500 MG PO TABS
1000.0000 mg | ORAL_TABLET | Freq: Once | ORAL | Status: AC
  Administered 2012-06-10: 1000 mg via ORAL

## 2012-06-10 MED ORDER — ACETAMINOPHEN 500 MG PO TABS
ORAL_TABLET | ORAL | Status: AC
  Administered 2012-06-10: 1000 mg via ORAL
  Filled 2012-06-10: qty 2

## 2012-06-10 MED ORDER — OXYCODONE-ACETAMINOPHEN 5-325 MG PO TABS: 1.0000 | ORAL_TABLET | Freq: Four times a day (QID) | ORAL | Status: AC | PRN

## 2012-06-10 MED ORDER — MORPHINE SULFATE 4 MG/ML IJ SOLN
4.0000 mg | Freq: Once | INTRAMUSCULAR | Status: AC
  Administered 2012-06-10: 4 mg via INTRAVENOUS
  Filled 2012-06-10: qty 1

## 2012-06-10 MED ORDER — PROMETHAZINE HCL 25 MG/ML IJ SOLN
INTRAMUSCULAR | Status: AC
  Administered 2012-06-10: 12.5 mg via INTRAVENOUS
  Filled 2012-06-10: qty 1

## 2012-06-10 MED ORDER — OXYCODONE-ACETAMINOPHEN 5-325 MG PO TABS
1.0000 | ORAL_TABLET | Freq: Once | ORAL | Status: AC
  Administered 2012-06-10: 1 via ORAL
  Filled 2012-06-10: qty 1

## 2012-06-10 NOTE — ED Provider Notes (Cosign Needed)
History   This chart was scribed for Deanna Lennert, MD by Charolett Bumpers . The patient was seen in room APA12/APA12. Patient's care was started at 2023.    CSN: [id number]  Arrival date & time 06/10/12  2004   First MD Initiated Contact with Patient 06/10/12 2023      Chief Complaint  Patient presents with  . Chest Pain    (Consider location/radiation/quality/duration/timing/severity/associated sxs/prior treatment) HPI Comments: Deanna Brandt is a 51 y.o. female who presents to the Emergency Department complaining of central chest pain that radiates to left chest and left arm with an onset of earlier today. Pt describes the chest pain as a pressure with a sudden onset. Pt states she has associated nausea, vomiting, headache and SOB. Pt states her symptoms are aggravated with deep breaths. 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 Pt denies taking anticoagulants and is allergic to aspirin    Patient is a 51 y.o. female presenting with chest pain. The history is provided by the patient.  Chest Pain The chest pain began 3 - 5 hours ago. Chest pain occurs constantly. The pain is associated with breathing. The severity of the pain is moderate. The quality of the pain is described as pressure-like. The pain radiates to the left arm. Primary symptoms include shortness of breath, nausea and vomiting. Pertinent negatives for primary symptoms include no fatigue, no cough and no abdominal pain. She tried nothing for the symptoms.  Pertinent negatives for past medical history include no seizures.     Past Medical History  Diagnosis Date  . Depression   . Anxiety   . Migraine   . DVT (deep venous thrombosis)     Past Surgical History  Procedure Date  . Back surgery   . Cholecystectomy   . Appendectomy   . Abdominal hysterectomy     History reviewed. No pertinent family history.  History  Substance Use Topics  . Smoking status:  Never Smoker   . Smokeless tobacco: Not on file  . Alcohol Use: No    OB History    Grav Para Term Preterm Abortions TAB SAB Ect Mult Living                  Review of Systems  Constitutional: Negative for fatigue.  HENT: Negative for congestion, sinus pressure and ear discharge.   Eyes: Negative for discharge.  Respiratory: Positive for shortness of breath. Negative for cough.   Cardiovascular: Positive for chest pain.  Gastrointestinal: Positive for nausea and vomiting. Negative for abdominal pain and diarrhea.  Genitourinary: Negative for frequency and hematuria.  Musculoskeletal: Negative for back pain.  Skin: Negative for rash.  Neurological: Positive for headaches. Negative for seizures.  Hematological: Negative.   Psychiatric/Behavioral: Negative for hallucinations.  All other systems reviewed and are negative.    Allergies  Aspirin; Bee venom; Compazine; Ibuprofen; Reglan; Zofran; and Ivp dye  Home Medications   Current Outpatient Rx  Name Route Sig Dispense Refill  . ALPRAZOLAM 1 MG PO TABS Oral Take 1 mg by mouth 3 (three) times daily as needed. For anxiety      . TIZANIDINE HCL 2 MG PO CAPS Oral Take 4 mg by mouth 3 (three) times daily as needed. For muscle spasms     . ZIPRASIDONE HCL 60 MG PO CAPS Oral Take 60 mg by mouth daily.      Marland Kitchen ZOLPIDEM TARTRATE 10 MG PO TABS Oral Take  10 mg by mouth at bedtime as needed. For sleep       BP 155/85  Pulse 108  Temp 98.6 F (37 C) (Oral)  Resp 18  Ht 5\' 5"  (1.651 m)  Wt 155 lb (70.308 kg)  BMI 25.79 kg/m2  SpO2 100%  Physical Exam  Constitutional: She is oriented to person, place, and time. She appears well-developed.  HENT:  Head: Normocephalic and atraumatic.  Eyes: Conjunctivae and EOM are normal. No scleral icterus.  Neck: Neck supple. No thyromegaly present.  Cardiovascular: Regular rhythm and normal heart sounds.  Tachycardia present.  Exam reveals no gallop and no friction rub.   No murmur  heard. Pulmonary/Chest: Effort normal and breath sounds normal. No stridor. She has no wheezes. She has no rales. She exhibits no tenderness.  Abdominal: She exhibits no distension. There is no tenderness. There is no rebound.  Musculoskeletal: Normal range of motion. She exhibits no edema.  Lymphadenopathy:    She has no cervical adenopathy.  Neurological: She is oriented to person, place, and time. Coordination normal.  Skin: No rash noted. No erythema.  Psychiatric: She has a normal mood and affect. Her behavior is normal.    ED Course  Procedures (including critical care time)  DIAGNOSTIC STUDIES: Oxygen Saturation is 100% on room air, normal by my interpretation.    COORDINATION OF CARE:  20:44-Discussed planned course of treatment with the patient including chest x-ray, blood work, pain medication and Nitroglycerin, who is agreeable at this time.   20:45-Medication Orders: Nitroglycerin (Nitrostat) SL tablet 0.4 mg-once  21:00-Medication Orders: Morphine 4 mg/mL injection 4 mg-once.   Results for orders placed during the hospital encounter of 06/10/12  CBC WITH DIFFERENTIAL      Component Value Range   WBC 6.9  4.0 - 10.5 K/uL   RBC 4.36  3.87 - 5.11 MIL/uL   Hemoglobin 12.4  12.0 - 15.0 g/dL   HCT 96.0  45.4 - 09.8 %   MCV 87.4  78.0 - 100.0 fL   MCH 28.4  26.0 - 34.0 pg   MCHC 32.5  30.0 - 36.0 g/dL   RDW 11.9  14.7 - 82.9 %   Platelets 188  150 - 400 K/uL   Neutrophils Relative 67  43 - 77 %   Neutro Abs 4.6  1.7 - 7.7 K/uL   Lymphocytes Relative 27  12 - 46 %   Lymphs Abs 1.9  0.7 - 4.0 K/uL   Monocytes Relative 4  3 - 12 %   Monocytes Absolute 0.3  0.1 - 1.0 K/uL   Eosinophils Relative 2  0 - 5 %   Eosinophils Absolute 0.1  0.0 - 0.7 K/uL   Basophils Relative 0  0 - 1 %   Basophils Absolute 0.0  0.0 - 0.1 K/uL  COMPREHENSIVE METABOLIC PANEL      Component Value Range   Sodium 141  135 - 145 mEq/L   Potassium 3.3 (*) 3.5 - 5.1 mEq/L   Chloride 104  96 -  112 mEq/L   CO2 26  19 - 32 mEq/L   Glucose, Bld 102 (*) 70 - 99 mg/dL   BUN 11  6 - 23 mg/dL   Creatinine, Ser 5.62  0.50 - 1.10 mg/dL   Calcium 13.0  8.4 - 86.5 mg/dL   Total Protein 7.7  6.0 - 8.3 g/dL   Albumin 4.5  3.5 - 5.2 g/dL   AST 31  0 - 37 U/L   ALT  16  0 - 35 U/L   Alkaline Phosphatase 103  39 - 117 U/L   Total Bilirubin 1.8 (*) 0.3 - 1.2 mg/dL   GFR calc non Af Amer 84 (*) >90 mL/min   GFR calc Af Amer >90  >90 mL/min  TROPONIN I      Component Value Range   Troponin I <0.30  <0.30 ng/mL  D-DIMER, QUANTITATIVE      Component Value Range   D-Dimer, Quant 0.90 (*) 0.00 - 0.48 ug/mL-FEU  ' Dg Chest Portable 1 View  06/10/2012  *RADIOLOGY REPORT*  Clinical Data: 51 year old female with chest pain.  PORTABLE CHEST - 1 VIEW  Comparison: [facility] 1436 hours the same day earlier.  Findings: Portable semi upright AP view at 2048 hours.  Stable and normal lung volumes.  Cardiac size and mediastinal contours are within normal limits.  Visualized tracheal air column is within normal limits.  Lung parenchyma stable and clear.  No pneumothorax or effusion.  IMPRESSION: No acute cardiopulmonary abnormality. Note that this patient had a pulmonary perfusion nuclear medicine scan at [facility] earlier today.   Original Report Authenticated By: Harley Hallmark, M.D.      No diagnosis found.  Pt was in the hospital last week and a stress test is scheduled for out pt  Date: 06/10/2012  Rate:113  Rhythm: sinus tachycardia  QRS Axis: normal  Intervals: normal  ST/T Wave abnormalities: nonspecific ST changes  Conduction Disutrbances:none  Narrative Interpretation:   Old EKG Reviewed: none available   MDM    The chart was scribed for me under my direct supervision.  I personally performed the history, physical, and medical decision making and all procedures in the evaluation of this patient.Deanna Lennert, MD 06/10/12 [phone]

## 2012-06-10 NOTE — ED Notes (Signed)
C/o HA after nitroglycerin administration. EDP notified and orders rec'd.

## 2012-06-10 NOTE — ED Notes (Signed)
Patient complaining of central chest pain radiating to left chest and to left arm. Patient also report abdominal pain from vomiting. Patient reports pains starting a few hours ago.

## 2012-06-10 NOTE — ED Notes (Signed)
C/o chest pain x 3 hours onset while playing with grandchildren; states the pain is mid chest, radiates to left shoulder and neck, and feels like pressure. Reports pain worse with cough.  Placed on cardiac monitor; sinus tachycardia rate 112.

## 2012-06-10 NOTE — ED Notes (Signed)
Requesting medication for nausea; EDP notified and orders rec'd.

## 2012-06-11 LAB — COMPREHENSIVE METABOLIC PANEL
ALT: 16 U/L (ref 0–35)
Alkaline Phosphatase: 103 U/L (ref 39–117)
BUN: 11 mg/dL (ref 6–23)
CO2: 26 mEq/L (ref 19–32)
Chloride: 106 mEq/L (ref 96–112)
GFR calc Af Amer: >90 mL/min (ref >90)
GFR calc non Af Amer: 84 mL/min — ABNORMAL LOW (ref >90)
Glucose, Bld: 102 mg/dL — ABNORMAL HIGH (ref 70–99)
Potassium: 3.4 mEq/L — ABNORMAL LOW (ref 3.5–5.1)
Sodium: 145 mEq/L (ref 135–145)
Total Bilirubin: 1.8 mg/dL — ABNORMAL HIGH (ref 0.3–1.2)
Total Protein: 7.7 g/dL (ref 6.0–8.3)

## 2012-11-23 ENCOUNTER — Encounter (HOSPITAL_COMMUNITY): Payer: Self-pay | Admitting: Emergency Medicine

## 2012-11-23 ENCOUNTER — Emergency Department (HOSPITAL_COMMUNITY): Payer: Medicare Other

## 2012-11-23 ENCOUNTER — Emergency Department (HOSPITAL_COMMUNITY)
Admission: EM | Admit: 2012-11-23 | Discharge: 2012-11-23 | Disposition: A | Discharge status: Home / Self Care | Payer: Medicare Other | Attending: Emergency Medicine | Admitting: Emergency Medicine

## 2012-11-23 DIAGNOSIS — Y939 Activity, unspecified: Secondary | ICD-10-CM | POA: Insufficient documentation

## 2012-11-23 DIAGNOSIS — IMO0002 Reserved for concepts with insufficient information to code with codable children: Secondary | ICD-10-CM | POA: Insufficient documentation

## 2012-11-23 DIAGNOSIS — Z86718 Personal history of other venous thrombosis and embolism: Secondary | ICD-10-CM | POA: Insufficient documentation

## 2012-11-23 DIAGNOSIS — S0993XA Unspecified injury of face, initial encounter: Secondary | ICD-10-CM | POA: Insufficient documentation

## 2012-11-23 DIAGNOSIS — M545 Low back pain, unspecified: Secondary | ICD-10-CM

## 2012-11-23 DIAGNOSIS — R079 Chest pain, unspecified: Secondary | ICD-10-CM | POA: Insufficient documentation

## 2012-11-23 DIAGNOSIS — F3289 Other specified depressive episodes: Secondary | ICD-10-CM | POA: Insufficient documentation

## 2012-11-23 DIAGNOSIS — R059 Cough, unspecified: Secondary | ICD-10-CM | POA: Insufficient documentation

## 2012-11-23 DIAGNOSIS — R05 Cough: Secondary | ICD-10-CM | POA: Insufficient documentation

## 2012-11-23 DIAGNOSIS — R21 Rash and other nonspecific skin eruption: Secondary | ICD-10-CM | POA: Insufficient documentation

## 2012-11-23 DIAGNOSIS — F411 Generalized anxiety disorder: Secondary | ICD-10-CM | POA: Insufficient documentation

## 2012-11-23 DIAGNOSIS — Y929 Unspecified place or not applicable: Secondary | ICD-10-CM | POA: Insufficient documentation

## 2012-11-23 DIAGNOSIS — Z79899 Other long term (current) drug therapy: Secondary | ICD-10-CM | POA: Insufficient documentation

## 2012-11-23 DIAGNOSIS — R6883 Chills (without fever): Secondary | ICD-10-CM | POA: Insufficient documentation

## 2012-11-23 DIAGNOSIS — R112 Nausea with vomiting, unspecified: Secondary | ICD-10-CM | POA: Insufficient documentation

## 2012-11-23 DIAGNOSIS — Z8679 Personal history of other diseases of the circulatory system: Secondary | ICD-10-CM | POA: Insufficient documentation

## 2012-11-23 DIAGNOSIS — W19XXXA Unspecified fall, initial encounter: Secondary | ICD-10-CM | POA: Insufficient documentation

## 2012-11-23 LAB — CBC
HCT: 40.4 % (ref 36.0–46.0)
Hemoglobin: 13.6 g/dL (ref 12.0–15.0)
MCH: 29.9 pg (ref 26.0–34.0)
MCHC: 33.7 g/dL (ref 30.0–36.0)
MCV: 88.8 fL (ref 78.0–100.0)
Platelets: 252 10*3/uL (ref 150–400)
RBC: 4.55 MIL/uL (ref 3.87–5.11)
RDW: 13.7 % (ref 11.5–15.5)
WBC: 5.3 10*3/uL (ref 4.0–10.5)

## 2012-11-23 LAB — BASIC METABOLIC PANEL
BUN: 10 mg/dL (ref 6–23)
CO2: 23 mEq/L (ref 19–32)
Calcium: 10.6 mg/dL — ABNORMAL HIGH (ref 8.4–10.5)
Chloride: 107 mEq/L (ref 96–112)
Creatinine, Ser: 0.71 mg/dL (ref 0.50–1.10)
GFR calc Af Amer: >90 mL/min (ref >90)
GFR calc non Af Amer: >90 mL/min (ref >90)
Glucose, Bld: 119 mg/dL — ABNORMAL HIGH (ref 70–99)
Potassium: 3.3 mEq/L — ABNORMAL LOW (ref 3.5–5.1)
Sodium: 142 mEq/L (ref 135–145)

## 2012-11-23 LAB — TROPONIN I: Troponin I: <0.3 ng/mL (ref <0.30)

## 2012-11-23 MED ORDER — OXYCODONE-ACETAMINOPHEN 5-325 MG PO TABS: 1.0000 | ORAL_TABLET | ORAL | Status: DC | PRN

## 2012-11-23 MED ORDER — OXYCODONE-ACETAMINOPHEN 5-325 MG PO TABS
2.0000 | ORAL_TABLET | Freq: Once | ORAL | Status: AC
  Administered 2012-11-23: 2 via ORAL
  Filled 2012-11-23: qty 2

## 2012-11-23 MED ORDER — PROMETHAZINE HCL 12.5 MG PO TABS
25.0000 mg | ORAL_TABLET | Freq: Once | ORAL | Status: AC
  Administered 2012-11-23: 25 mg via ORAL
  Filled 2012-11-23: qty 2

## 2012-11-23 MED ORDER — HYDROMORPHONE HCL PF 1 MG/ML IJ SOLN
1.0000 mg | Freq: Once | INTRAMUSCULAR | Status: AC
  Administered 2012-11-23: 1 mg via INTRAMUSCULAR
  Filled 2012-11-23: qty 1

## 2012-11-23 NOTE — ED Notes (Signed)
Chest pain began yesterday, gradually worsening overnight.  Vomiting overnight 4+ times.  Some loose stools.  Minimal abdominal pain from umbilicus distally. Patient has had cervical spine surgery in 2004 and lumbar surgery in 1999.

## 2012-11-23 NOTE — ED Notes (Signed)
Pt c/o fall x 1 week ago and fell on buttocks. C/o pain to buttocks. Came today due chest pain to L side radiating into L arm x 2 days. Denies sob/dizziness/ c/o n/v since cp started.

## 2012-11-23 NOTE — ED Provider Notes (Signed)
History  This chart was scribed for Deanna Razor, MD, by Candelaria Stagers, ED Scribe. This patient was seen in room APA17/APA17 and the patient's care was started at 11:22 AM   CSN: [id number]  Arrival date & time 11/23/12  1026   First MD Initiated Contact with Patient 11/23/12 1046      Chief Complaint  Patient presents with  . Fall  . Chest Pain     The history is provided by the patient. No language interpreter was used.   Deanna Brandt is a 52 y.o. female who presents to the Emergency Department complaining of constant left sided radiating chest pain that started two days ago and is gradually worsening. Constant. The pain is radiating to her left arm.  Nothing seems to make the sx better or worse.  She describes the pain as a stabbing pressure.  She has also experienced a cough associated with chest pain and reports that she experienced chills, nausea, and vomiting at onset of chest pain which has now subsided.  At the onset of pain pt was at rest.  She denies new leg swelling.  Pt has taken nothing for the pain.  She has no h/o heart problems.  She reports h/o blood clot to right leg two years ago that was treated with warfarin.  She has had normal stress test and normal catheterization in the past per her recollection.  Pt also reports that she fell about one week ago landing on her buttocks.  She has experienced neck pain and back pain associated with the fall.         Past Medical History  Diagnosis Date  . Depression   . Anxiety   . Migraine   . DVT (deep venous thrombosis)     Past Surgical History  Procedure Laterality Date  . Back surgery    . Cholecystectomy    . Appendectomy    . Abdominal hysterectomy      History reviewed. No pertinent family history.  History  Substance Use Topics  . Smoking status: Never Smoker   . Smokeless tobacco: Not on file  . Alcohol Use: No    OB History   Grav Para Term Preterm Abortions TAB SAB Ect Mult Living                   Review of Systems  Respiratory: Positive for cough.   Cardiovascular: Positive for chest pain. Negative for leg swelling.  Gastrointestinal: Positive for nausea and vomiting. Negative for abdominal pain.  All other systems reviewed and are negative.    Allergies  Aspirin; Bee venom; Compazine; Ibuprofen; Reglan; Zofran; and Ivp dye  Home Medications   Current Outpatient Rx  Name  Route  Sig  Dispense  Refill  . ALPRAZolam (XANAX) 1 MG tablet   Oral   Take 1 mg by mouth 3 (three) times daily as needed. For anxiety           . tizanidine (ZANAFLEX) 2 MG capsule   Oral   Take 4 mg by mouth 3 (three) times daily as needed. For muscle spasms          . ziprasidone (GEODON) 60 MG capsule   Oral   Take 60 mg by mouth daily.             BP 133/114  Pulse 103  Temp(Src) 98.5 F (36.9 C) (Oral)  Resp 19  SpO2 100%  Physical Exam  Nursing note and vitals reviewed. Constitutional:  She is oriented to person, place, and time. She appears well-developed and well-nourished. No distress.  HENT:  Head: Normocephalic and atraumatic.  Eyes: Conjunctivae are normal.  Neck: Normal range of motion. Neck supple.  Cardiovascular: Normal rate and regular rhythm.   No murmur heard. Pulmonary/Chest: Effort normal. No respiratory distress. She has no wheezes. She has no rales.  Abdominal: Soft. Bowel sounds are normal. There is no tenderness.  Musculoskeletal: Normal range of motion. She exhibits no edema and no tenderness.  Lower extremities symmetric as compared to each other. No calf tenderness. Negative Homan's. No palpable cords. Tenderness to L lower back midline and paraspinally. No overlying skin changes.  Neurological: She is alert and oriented to person, place, and time. She exhibits normal muscle tone.  Strength 5/5 b/l LE  Skin: Skin is warm and dry. Rash (non specific papular rash to upper chest and back, erythematous blanches, non tender) noted. She is not  diaphoretic.  Psychiatric: She has a normal mood and affect. Her behavior is normal. Thought content normal.    ED Course  Procedures   DIAGNOSTIC STUDIES: Oxygen Saturation is 100% on room air, normal by my interpretation.    COORDINATION OF CARE:  11:28 AM Will order chest xray, lumbar spine xray, and blood work.  Pt request medication for pain and nausea.  Discussed plan with pt at bedside, pt understands and agrees.    11:45 AM Ordered 2 tablets of 5-325 MG Percocet/Roxicet  12:15 PM Ordered 25 mg tablet Phenergan    Labs Reviewed  BASIC METABOLIC PANEL - Abnormal; Notable for the following:    Potassium 3.3 (*)    Glucose, Bld 119 (*)    Calcium 10.6 (*)    All other components within normal limits  TROPONIN I  CBC   Dg Chest 2 View  11/23/2012  *RADIOLOGY REPORT*  Clinical Data: Fall with low back pain and chest pain.  CHEST - 2 VIEW  Comparison: 09/05/2012.  Findings: Trachea is midline.  Heart size normal.  Lungs are clear. No pleural fluid.  No pneumothorax.  Osseous structures appear grossly intact.  IMPRESSION: No acute findings.   Original Report Authenticated By: Leanna Battles, M.D.    Dg Lumbar Spine Complete  11/23/2012  *RADIOLOGY REPORT*  Clinical Data: History of injury from fall.  Low back pain.  LUMBAR SPINE - COMPLETE 4+ VIEW  Comparison: CT 04/26/2012.  Findings: There are five non-rib bearing lumbar-type vertebral bodies.  Interbody fusion has been performed at L4-L5 level.  No disruption of hardware is seen.  There is slight narrowing of the intervertebral disc space at the L4-L5 level.  Minimal degenerative spondylosis changes are seen.  No fracture or bony destruction is seen.  No pars defect is demonstrated.  No fracture or subluxation is evident.  Cholecystectomy clips are seen.  IMPRESSION: No fracture or dislocation is evident.  Postoperative changes are seen at L4-L5 level.  Degenerative spondylosis.   Original Report Authenticated By: Onalee Hua Call     EKG:  Rhythm: normal sinus Vent. rate 89 BPM PR interval 136 ms QRS duration 80 ms QT/QTc 376/457 ms Abnormal R wave progression ST segments:NS ST changes   1. Chest pain   2. Lower back pain       MDM  52 year old female with chest pain. Atypical for ACS given constant duration for approximately 2 days. Troponin is normal. EKG with no ischemic changes. Chest x-ray is clear. Doubt infectious. Doubt pulmonary embolism. Doubt dissection. Patient also complaining of  lower back pain. Imaging unremarkable. Suspect contusion and sprain. Plan symptomatic treatment. Emergent return precautions were discussed. Recommended following up with her PCP to discuss repeat stress testing.  I personally preformed the services scribed in my presence. The recorded information has been reviewed is accurate. Deanna Razor, MD.         Deanna Razor, MD 11/23/12 [phone]

## 2013-01-10 ENCOUNTER — Emergency Department (HOSPITAL_COMMUNITY): Payer: Medicare Other

## 2013-01-10 ENCOUNTER — Encounter (HOSPITAL_COMMUNITY): Payer: Self-pay

## 2013-01-10 ENCOUNTER — Emergency Department (HOSPITAL_COMMUNITY)
Admission: EM | Admit: 2013-01-10 | Discharge: 2013-01-10 | Disposition: A | Discharge status: Home / Self Care | Payer: Medicare Other | Attending: Emergency Medicine | Admitting: Emergency Medicine

## 2013-01-10 DIAGNOSIS — Z8679 Personal history of other diseases of the circulatory system: Secondary | ICD-10-CM | POA: Insufficient documentation

## 2013-01-10 DIAGNOSIS — R5381 Other malaise: Secondary | ICD-10-CM | POA: Insufficient documentation

## 2013-01-10 DIAGNOSIS — Z86718 Personal history of other venous thrombosis and embolism: Secondary | ICD-10-CM | POA: Insufficient documentation

## 2013-01-10 DIAGNOSIS — R0789 Other chest pain: Secondary | ICD-10-CM | POA: Insufficient documentation

## 2013-01-10 DIAGNOSIS — F329 Major depressive disorder, single episode, unspecified: Secondary | ICD-10-CM | POA: Insufficient documentation

## 2013-01-10 DIAGNOSIS — F411 Generalized anxiety disorder: Secondary | ICD-10-CM | POA: Insufficient documentation

## 2013-01-10 DIAGNOSIS — F3289 Other specified depressive episodes: Secondary | ICD-10-CM | POA: Insufficient documentation

## 2013-01-10 DIAGNOSIS — R509 Fever, unspecified: Secondary | ICD-10-CM | POA: Insufficient documentation

## 2013-01-10 LAB — COMPREHENSIVE METABOLIC PANEL
AST: 14 U/L (ref 0–37)
CO2: 23 mEq/L (ref 19–32)
Calcium: 10.3 mg/dL (ref 8.4–10.5)
Creatinine, Ser: 0.74 mg/dL (ref 0.50–1.10)
GFR calc Af Amer: >90 mL/min (ref >90)
GFR calc non Af Amer: >90 mL/min (ref >90)
Sodium: 143 mEq/L (ref 135–145)
Total Protein: 8.2 g/dL (ref 6.0–8.3)

## 2013-01-10 LAB — CBC WITH DIFFERENTIAL/PLATELET
Basophils Absolute: 0.1 10*3/uL (ref 0.0–0.1)
Eosinophils Absolute: 0.1 10*3/uL (ref 0.0–0.7)
Eosinophils Relative: 1 % (ref 0–5)
HCT: 36.6 % (ref 36.0–46.0)
Lymphocytes Relative: 28 % (ref 12–46)
MCH: 29.7 pg (ref 26.0–34.0)
MCHC: 34.4 g/dL (ref 30.0–36.0)
MCV: 86.3 fL (ref 78.0–100.0)
Monocytes Absolute: 0.3 10*3/uL (ref 0.1–1.0)
Platelets: 261 10*3/uL (ref 150–400)
RDW: 13.1 % (ref 11.5–15.5)
WBC: 5.9 10*3/uL (ref 4.0–10.5)

## 2013-01-10 LAB — D-DIMER, QUANTITATIVE: D-Dimer, Quant: <0.27 ug/mL-FEU (ref 0.00–0.48)

## 2013-01-10 MED ORDER — KETOROLAC TROMETHAMINE 30 MG/ML IJ SOLN
30.0000 mg | Freq: Once | INTRAMUSCULAR | Status: DC
  Filled 2013-01-10: qty 1

## 2013-01-10 MED ORDER — PROMETHAZINE HCL 25 MG/ML IJ SOLN
12.5000 mg | Freq: Once | INTRAMUSCULAR | Status: AC
  Administered 2013-01-10: 12.5 mg via INTRAVENOUS
  Filled 2013-01-10: qty 1

## 2013-01-10 MED ORDER — TRAMADOL HCL 50 MG PO TABS
50.0000 mg | ORAL_TABLET | Freq: Four times a day (QID) | ORAL | Status: AC | PRN
Start: 2013-01-10 — End: ?

## 2013-01-10 NOTE — ED Notes (Signed)
Pt reports waking with mid-sternal cp that radiates to back. She woke from sleep with pain this am.  Sob at times, +nausea. Stated that phenergan only helps her pain, waited for daughter to drive her to the er today.

## 2013-01-10 NOTE — ED Provider Notes (Signed)
History     CSN: [id number]  Arrival date & time 01/10/13  1634   First MD Initiated Contact with Patient 01/10/13 1648      Chief Complaint  Patient presents with  . Chest Pain    (Consider location/radiation/quality/duration/timing/severity/associated sxs/prior treatment) HPI Comments: Patient presents with complaints of pain in the left side of the chest that started this morning while at rest.  It is sharp in nature and she reports that it radiates into the left shoulder and arm.  There is no shortness of breath, diaphoresis.  She reports she had a pe in the past and has not been on her blood thinners since being discharged from [facility].  She was recently admitted there for some form of infection in her blood, she thinks possibly mrsa.  She was treated with iv antibiotics and just discharged on the 25th of March.    Patient is a 52 y.o. female presenting with chest pain. The history is provided by the patient.  Chest Pain Pain location:  L chest Pain quality: sharp   Pain radiates to:  L shoulder and L arm Pain radiates to the back: no   Pain severity:  Moderate Onset quality:  Sudden Duration: 10 hours. Timing:  Constant Progression:  Worsening Chronicity:  New Context: breathing and movement   Relieved by:  Nothing Associated symptoms: fatigue and fever   Associated symptoms: no abdominal pain, no cough, no dizziness and no shortness of breath     Past Medical History  Diagnosis Date  . Depression   . Anxiety   . Migraine   . DVT (deep venous thrombosis)     Past Surgical History  Procedure Laterality Date  . Back surgery    . Cholecystectomy    . Appendectomy    . Abdominal hysterectomy      No family history on file.  History  Substance Use Topics  . Smoking status: Never Smoker   . Smokeless tobacco: Not on file  . Alcohol Use: No    OB History   Grav Para Term Preterm Abortions TAB SAB Ect Mult Living                  Review of Systems   Constitutional: Positive for fever, chills and fatigue.  Respiratory: Negative for cough and shortness of breath.   Cardiovascular: Positive for chest pain. Negative for leg swelling.  Gastrointestinal: Negative for abdominal pain.  Neurological: Negative for dizziness.  All other systems reviewed and are negative.    Allergies  Aspirin; Bee venom; Compazine; Ibuprofen; Reglan; Seroquel; Serotonin reuptake inhibitors (ssris); Zofran; and Ivp dye  Home Medications   Current Outpatient Rx  Name  Route  Sig  Dispense  Refill  . ALPRAZolam (XANAX) 1 MG tablet   Oral   Take 1 mg by mouth 3 (three) times daily as needed. For anxiety           . oxyCODONE-acetaminophen (PERCOCET/ROXICET) 5-325 MG per tablet   Oral   Take 1 tablet by mouth every 4 (four) hours as needed for pain.   8 tablet   0   . tizanidine (ZANAFLEX) 2 MG capsule   Oral   Take 4 mg by mouth 3 (three) times daily as needed. For muscle spasms          . ziprasidone (GEODON) 60 MG capsule   Oral   Take 60 mg by mouth daily.  BP 169/112  Pulse 103  Temp(Src) 98.2 F (36.8 C) (Oral)  Resp 20  Ht 5\' 5"  (1.651 m)  Wt 152 lb (68.947 kg)  BMI 25.29 kg/m2  SpO2 100%  Physical Exam  Nursing note and vitals reviewed. Constitutional: She is oriented to person, place, and time. She appears well-developed and well-nourished. No distress.  HENT:  Head: Normocephalic and atraumatic.  Neck: Normal range of motion. Neck supple.  Cardiovascular: Normal rate and regular rhythm.  Exam reveals no gallop and no friction rub.   No murmur heard. Pulmonary/Chest: Effort normal and breath sounds normal. No respiratory distress. She has no wheezes.  Abdominal: Soft. Bowel sounds are normal. She exhibits no distension. There is no tenderness.  Musculoskeletal: Normal range of motion.  Neurological: She is alert and oriented to person, place, and time.  Skin: Skin is warm and dry. She is not diaphoretic.     ED Course  Procedures (including critical care time)  Labs Reviewed  CBC WITH DIFFERENTIAL  COMPREHENSIVE METABOLIC PANEL  TROPONIN I   No results found.   No diagnosis found.   Date: 01/10/2013  Rate: 106  Rhythm: sinus tachycardia  QRS Axis: normal  Intervals: normal  ST/T Wave abnormalities: normal  Conduction Disutrbances:none  Narrative Interpretation:   Old EKG Reviewed: none available    MDM  The patient presents with pain in the left side of the chest.  She is not hypoxic, tachycardic, and the D-Dimer is negative.  I have a low suspicion for PE.  There is no change in the ekg and the troponin is negative with two days worth of atypical symptoms.  There is no pneumonia, ptx or anything to suggest an acute process.  She tells me she is allergic to everything except narcotics.  I am uncomfortable with prescribing narcotic pain medications to her without some explanation of her pain.  She is also here from [place], [place] which is quite some distance from here.  I am unsure as to why she came this far.  She needs a pcp to discuss this with.  I will prescribe a small number of tramadol for her.          Geoffery Lyons, MD 01/10/13 [phone]

## 2013-01-10 NOTE — ED Notes (Signed)
Pt stated she was in the hosp in [place] for 2 days last month then transferred to [facility] for 2 weeks for a infection in her blood stream. Was released march 25th, poor historian as far as history -stated she thought she had a pmd who would take her but "that did not work out" and that her "prescriptions were written wrong and the pharmacy would not fill them", stated the only meds that she has currently are her "mental health meds" given to her by [facility] in [place], pt is resident of [place].

## 2013-01-10 NOTE — ED Notes (Signed)
Pt stated she was dropped off by her daughter and she went back home in [place], stated she was unsure if she would come back to pick her up if she was discharged.

## 2015-10-18 ENCOUNTER — Emergency Department (HOSPITAL_COMMUNITY): Payer: Medicaid - Out of State

## 2015-10-18 ENCOUNTER — Encounter (HOSPITAL_COMMUNITY): Payer: Self-pay | Admitting: Emergency Medicine

## 2015-10-18 ENCOUNTER — Emergency Department (HOSPITAL_COMMUNITY)
Admission: EM | Admit: 2015-10-18 | Discharge: 2015-10-18 | Disposition: A | Discharge status: Home / Self Care | Payer: Medicaid - Out of State | Attending: Emergency Medicine | Admitting: Emergency Medicine

## 2015-10-18 DIAGNOSIS — F329 Major depressive disorder, single episode, unspecified: Secondary | ICD-10-CM | POA: Insufficient documentation

## 2015-10-18 DIAGNOSIS — Z7901 Long term (current) use of anticoagulants: Secondary | ICD-10-CM | POA: Insufficient documentation

## 2015-10-18 DIAGNOSIS — I10 Essential (primary) hypertension: Secondary | ICD-10-CM | POA: Insufficient documentation

## 2015-10-18 DIAGNOSIS — Z86711 Personal history of pulmonary embolism: Secondary | ICD-10-CM | POA: Diagnosis not present

## 2015-10-18 DIAGNOSIS — R109 Unspecified abdominal pain: Secondary | ICD-10-CM

## 2015-10-18 DIAGNOSIS — F419 Anxiety disorder, unspecified: Secondary | ICD-10-CM | POA: Insufficient documentation

## 2015-10-18 DIAGNOSIS — N390 Urinary tract infection, site not specified: Secondary | ICD-10-CM | POA: Diagnosis not present

## 2015-10-18 DIAGNOSIS — R079 Chest pain, unspecified: Secondary | ICD-10-CM | POA: Insufficient documentation

## 2015-10-18 DIAGNOSIS — Z86718 Personal history of other venous thrombosis and embolism: Secondary | ICD-10-CM | POA: Insufficient documentation

## 2015-10-18 DIAGNOSIS — Z79899 Other long term (current) drug therapy: Secondary | ICD-10-CM | POA: Insufficient documentation

## 2015-10-18 HISTORY — DX: Essential (primary) hypertension: I10

## 2015-10-18 HISTORY — DX: Other pulmonary embolism without acute cor pulmonale: I26.99

## 2015-10-18 LAB — COMPREHENSIVE METABOLIC PANEL
ALT: 16 U/L (ref 14–54)
AST: 15 U/L (ref 15–41)
Albumin: 4.4 g/dL (ref 3.5–5.0)
Alkaline Phosphatase: 93 U/L (ref 38–126)
Anion gap: 10 (ref 5–15)
BUN: 16 mg/dL (ref 6–20)
CHLORIDE: 105 mmol/L (ref 101–111)
CO2: 28 mmol/L (ref 22–32)
Calcium: 10.6 mg/dL — ABNORMAL HIGH (ref 8.9–10.3)
Creatinine, Ser: 0.67 mg/dL (ref 0.44–1.00)
GFR calc Af Amer: >60 mL/min (ref >60)
GFR calc non Af Amer: >60 mL/min (ref >60)
Glucose, Bld: 106 mg/dL — ABNORMAL HIGH (ref 65–99)
Potassium: 3.8 mmol/L (ref 3.5–5.1)
Sodium: 143 mmol/L (ref 135–145)
Total Bilirubin: 0.7 mg/dL (ref 0.3–1.2)
Total Protein: 8 g/dL (ref 6.5–8.1)

## 2015-10-18 LAB — CBC WITH DIFFERENTIAL/PLATELET
BASOS ABS: 0 10*3/uL (ref 0.0–0.1)
BASOS PCT: 1 %
EOS PCT: 3 %
Eosinophils Absolute: 0.2 10*3/uL (ref 0.0–0.7)
HCT: 37 % (ref 36.0–46.0)
Hemoglobin: 11.9 g/dL — ABNORMAL LOW (ref 12.0–15.0)
Lymphocytes Relative: 26 %
Lymphs Abs: 1.6 10*3/uL (ref 0.7–4.0)
MCH: 29.1 pg (ref 26.0–34.0)
MCHC: 32.2 g/dL (ref 30.0–36.0)
MCV: 90.5 fL (ref 78.0–100.0)
Monocytes Absolute: 0.3 10*3/uL (ref 0.1–1.0)
Monocytes Relative: 5 %
NEUTROS ABS: 4.2 10*3/uL (ref 1.7–7.7)
Neutrophils Relative %: 65 %
PLATELETS: 227 10*3/uL (ref 150–400)
RBC: 4.09 MIL/uL (ref 3.87–5.11)
RDW: 14.7 % (ref 11.5–15.5)
WBC: 6.3 10*3/uL (ref 4.0–10.5)

## 2015-10-18 LAB — URINALYSIS, ROUTINE W REFLEX MICROSCOPIC
BILIRUBIN URINE: NEGATIVE
Glucose, UA: NEGATIVE mg/dL
Ketones, ur: NEGATIVE mg/dL
NITRITE: POSITIVE — AB
Protein, ur: NEGATIVE mg/dL
SPECIFIC GRAVITY, URINE: 1.025 (ref 1.005–1.030)
pH: 5.5 (ref 5.0–8.0)

## 2015-10-18 LAB — URINE MICROSCOPIC-ADD ON

## 2015-10-18 LAB — PROTIME-INR
INR: 2.29 — ABNORMAL HIGH (ref 0.00–1.49)
Prothrombin Time: 25 seconds — ABNORMAL HIGH (ref 11.6–15.2)

## 2015-10-18 LAB — TROPONIN I: Troponin I: <0.03 ng/mL (ref <0.031)

## 2015-10-18 LAB — LIPASE, BLOOD: Lipase: 40 U/L (ref 11–51)

## 2015-10-18 MED ORDER — CIPROFLOXACIN HCL 500 MG PO TABS: 500.0000 mg | ORAL_TABLET | Freq: Two times a day (BID) | ORAL | Status: DC

## 2015-10-18 MED ORDER — CIPROFLOXACIN HCL 500 MG PO TABS: 500.0000 mg | ORAL_TABLET | Freq: Two times a day (BID) | ORAL | Status: AC

## 2015-10-18 MED ORDER — PROMETHAZINE HCL 25 MG/ML IJ SOLN
25.0000 mg | Freq: Once | INTRAMUSCULAR | Status: AC
  Administered 2015-10-18: 25 mg via INTRAMUSCULAR
  Filled 2015-10-18: qty 1

## 2015-10-18 MED ORDER — HYDROCODONE-ACETAMINOPHEN 5-325 MG PO TABS: 2.0000 | ORAL_TABLET | ORAL | Status: AC | PRN

## 2015-10-18 MED ORDER — SODIUM CHLORIDE 0.9 % IV BOLUS (SEPSIS): 1000.0000 mL | Freq: Once | INTRAVENOUS | Status: DC

## 2015-10-18 NOTE — ED Notes (Signed)
Pt reports sudden onset of LT sided CP that radiates to LT arm with SOB, n/v, and dizziness that woke her up this morning. Pt also reports RLQ abdominal pain that began last night with vaginal bleeding. Pt hx of PE.

## 2015-10-18 NOTE — Discharge Instructions (Signed)

## 2015-10-18 NOTE — ED Provider Notes (Signed)
CSN: [id number]     Arrival date & time 10/18/15  1732 History   First MD Initiated Contact with Patient 10/18/15 1746     Chief Complaint  Patient presents with  . Chest Pain     (Consider location/radiation/quality/duration/timing/severity/associated sxs/prior Treatment) HPI Comments: The patient is a 55 year old female, she has a history of pulmonary embolism, she is on Coumadin.  She states that she takes 4 mg of Coumadin a day, she has taken this chronically, she has had no other recent changes in her medications. She reports that yesterday she started to develop some right lower abdominal pain, today she was having some small amount of blood which she thought was vaginal bleeding but was spotting in both her underwear and on the toilet paper when she wiped. She reports having some increased right-sided pain today including right lower quadrant, right mid abdomen and right flank. She states that she passed a kidney stone approximately one month ago. She also has a history of chest pain that started today. She describes this as a heaviness on her chest, goes to her left arm, she states that she has pain with deep breathing. The patient does have a history of pulmonary embolism, she denies having palpitations, states that she had been worked up for coronary artery disease multiple times in the past including a stress test in 2012 which she reports was normal as well as a stress test and heart catheterization within the last 6 months which she reports were normal. The studies were performed at outside hospitals. We do not have those records available.  The patient denies fevers, chills, cough, swelling of the legs, dysuria, diarrhea or changes in bowel habits.  Patient is a 55 y.o. female presenting with chest pain. The history is provided by the patient.  Chest Pain   Past Medical History  Diagnosis Date  . Depression   . Anxiety   . Migraine   . DVT (deep venous thrombosis) (HCC)   .  Pulmonary embolism (HCC)   . Hypertension    Past Surgical History  Procedure Laterality Date  . Back surgery    . Cholecystectomy    . Appendectomy    . Abdominal hysterectomy     No family history on file. Social History  Substance Use Topics  . Smoking status: Never Smoker   . Smokeless tobacco: None  . Alcohol Use: No   OB History    No data available     Review of Systems  Cardiovascular: Positive for chest pain.  All other systems reviewed and are negative.     Allergies  Aspirin; Bee venom; Compazine; Ibuprofen; Reglan; Seroquel; Serotonin reuptake inhibitors (ssris); Toradol; Zofran; and Sumatriptan succinate  Home Medications   Prior to Admission medications   Medication Sig Start Date End Date Taking? Authorizing Provider  ALPRAZolam Prudy Feeler(XANAX) 1 MG tablet Take 1 mg by mouth 3 (three) times daily as needed for anxiety. For anxiety    Yes Historical Provider, MD  metoprolol tartrate (LOPRESSOR) 25 MG tablet Take 50 mg by mouth 2 (two) times daily. 09/29/15  Yes Historical Provider, MD  temazepam (RESTORIL) 15 MG capsule Take 15 mg by mouth at bedtime as needed for sleep.   Yes Historical Provider, MD  tiZANidine (ZANAFLEX) 4 MG tablet Take 4 mg by mouth 3 (three) times daily. 4 09/30/15  Yes Historical Provider, MD  warfarin (COUMADIN) 4 MG tablet Take 4 mg by mouth daily. 10/14/15  Yes Historical Provider, MD  ziprasidone (GEODON)  60 MG capsule Take 60 mg by mouth at bedtime.    Yes Historical Provider, MD  ciprofloxacin (CIPRO) 500 MG tablet Take 1 tablet (500 mg total) by mouth 2 (two) times daily. 10/18/15   Eber Hong, MD  HYDROcodone-acetaminophen (NORCO/VICODIN) 5-325 MG tablet Take 2 tablets by mouth every 4 (four) hours as needed for moderate pain. 10/18/15   Eber Hong, MD  traMADol (ULTRAM) 50 MG tablet Take 1 tablet (50 mg total) by mouth every 6 (six) hours as needed for pain. 01/10/13   Geoffery Lyons, MD   BP 150/94 mmHg  Pulse 81  Temp(Src) 98.1 F  (36.7 C) (Oral)  Resp 15  Ht 5\' 5"  (1.651 m)  Wt 165 lb (74.844 kg)  BMI 27.46 kg/m2  SpO2 98% Physical Exam  Constitutional: She appears well-developed and well-nourished. No distress.  HENT:  Head: Normocephalic and atraumatic.  Mouth/Throat: Oropharynx is clear and moist. No oropharyngeal exudate.  Eyes: Conjunctivae and EOM are normal. Pupils are equal, round, and reactive to light. Right eye exhibits no discharge. Left eye exhibits no discharge. No scleral icterus.  Neck: Normal range of motion. Neck supple. No JVD present. No thyromegaly present.  Cardiovascular: Normal rate, regular rhythm, normal heart sounds and intact distal pulses.  Exam reveals no gallop and no friction rub.   No murmur heard. Pulmonary/Chest: Effort normal and breath sounds normal. No respiratory distress. She has no wheezes. She has no rales.  Abdominal: Soft. Bowel sounds are normal. She exhibits no distension and no mass. There is tenderness ( focal reproducible ttp in the RLQ ./ RUQ and R flank - no L sided ttp).  Musculoskeletal: Normal range of motion. She exhibits no edema or tenderness.  No edema or asymetry to the LE's bilaterally  Lymphadenopathy:    She has no cervical adenopathy.  Neurological: She is alert. Coordination normal.  Skin: Skin is warm and dry. No rash noted. No erythema.  Psychiatric: She has a normal mood and affect. Her behavior is normal.  Nursing note and vitals reviewed.   ED Course  Procedures (including critical care time) Labs Review Labs Reviewed  CBC WITH DIFFERENTIAL/PLATELET - Abnormal; Notable for the following:    Hemoglobin 11.9 (*)    All other components within normal limits  COMPREHENSIVE METABOLIC PANEL - Abnormal; Notable for the following:    Glucose, Bld 106 (*)    Calcium 10.6 (*)    All other components within normal limits  URINALYSIS, ROUTINE W REFLEX MICROSCOPIC (NOT AT [facility]) - Abnormal; Notable for the following:    Hgb urine dipstick LARGE (*)     Nitrite POSITIVE (*)    Leukocytes, UA SMALL (*)    All other components within normal limits  PROTIME-INR - Abnormal; Notable for the following:    Prothrombin Time 25.0 (*)    INR 2.29 (*)    All other components within normal limits  URINE MICROSCOPIC-ADD ON - Abnormal; Notable for the following:    Squamous Epithelial / LPF 0-5 (*)    Bacteria, UA FEW (*)    All other components within normal limits  URINE CULTURE  TROPONIN I  LIPASE, BLOOD    Imaging Review Ct Abdomen Pelvis Wo Contrast  10/18/2015  CLINICAL DATA:  Right lower quadrant and right flank pain for 1 day EXAM: CT ABDOMEN AND PELVIS WITHOUT CONTRAST TECHNIQUE: Multidetector CT imaging of the abdomen and pelvis was performed following the standard protocol without IV contrast. Oral contrast was administered. COMPARISON:  September 24, 2015 FINDINGS: Lower chest:  Lung bases are clear. Hepatobiliary: No focal liver lesions are identified on this noncontrast enhanced study. Gallbladder is absent. There is no appreciable biliary duct dilatation. Pancreas: No pancreatic mass or inflammatory focus. Spleen: No splenic lesions are identified. Adrenals/Urinary Tract: Adrenals appear normal bilaterally. There is no renal mass or hydronephrosis on either side. There is a stable 4 mm calculus in the lateral mid right kidney. No other intrarenal calculi are identified. There are no ureteral calculi on either side. Multiple pelvic phleboliths are near but separate from the ureters. Urinary bladder is midline with wall thickness within normal limits. Stomach/Bowel: There are scattered sigmoid diverticula without diverticulitis. There is no bowel wall or mesenteric thickening. No bowel obstruction. No free air or portal venous air. Vascular/Lymphatic: There is no abdominal aortic aneurysm. No vascular lesions are appreciable on this noncontrast enhanced study. There is no apparent adenopathy by size criteria in the abdomen or pelvis. Note that  there are several subcentimeter lymph nodes in the right mid to lower abdomen. Reproductive: Uterus is absent. There is no pelvic mass or pelvic fluid collection. Other: Appendix is absent. No ascites or abscess in the abdomen or pelvis. There is no right lower quadrant inflammatory lesion. There is a minimal ventral hernia containing only fat. Musculoskeletal: There is postoperative change at L4-5. There are no blastic or lytic bone lesions. There is no intramuscular or abdominal wall lesion. IMPRESSION: Nonobstructing calculus mid right kidney. No hydronephrosis or ureteral calculus on either side. Uterus, appendix, and gallbladder absent. There is no bowel obstruction. No abscess. No adenopathy by size criteria in the abdomen or pelvis. Several small lymph nodes in the right mid to lower abdomen or noted. In the appropriate clinical setting, these lymph nodes potentially could represent a degree of mesenteric adenitis. Postoperative change at L4-5. Minimal ventral hernia containing only fat. There are scattered sigmoid diverticula without diverticulitis. Electronically Signed   By: Bretta Bang III M.D.   On: 10/18/2015 20:37   Dg Chest 2 View  10/18/2015  CLINICAL DATA:  Acute onset of left-sided chest pain, radiating to the left arm, with shortness of breath, nausea, vomiting and dizziness. Right lower quadrant abdominal pain and vaginal bleeding. Initial encounter. EXAM: CHEST  2 VIEW COMPARISON:  Chest radiograph performed 01/10/2013 FINDINGS: The lungs are well-aerated and clear. There is no evidence of focal opacification, pleural effusion or pneumothorax. The heart is normal in size; the mediastinal contour is within normal limits. No acute osseous abnormalities are seen. Cervical spinal fusion hardware is partially imaged. Clips are noted within the right upper quadrant, reflecting prior cholecystectomy. IMPRESSION: No acute cardiopulmonary process seen. Electronically Signed   By: Roanna Raider  M.D.   On: 10/18/2015 20:45   I have personally reviewed and evaluated these images and lab results as part of my medical decision-making.   EKG Interpretation   Date/Time:  Saturday October 18 2015 17:47:09 EST Ventricular Rate:  84 PR Interval:  131 QRS Duration: 101 QT Interval:  365 QTC Calculation: 431 R Axis:   26 Text Interpretation:  Sinus rhythm RSR' in V1 or V2, right VCD or RVH  Baseline wander in lead(s) V4 V5 Since last tracing rate slower Confirmed  by Ebelin Dillehay  MD, Varsha Knock ([id number]) on 10/18/2015 6:08:36 PM      MDM   Final diagnoses:  Abdominal pain, unspecified abdominal location  Chest pain, unspecified chest pain type  UTI (lower urinary tract infection)    #1 abdominal pain  and bleeding, she will need to be further evaluated for potential kidney stone, urinary tract infection or other intra-abdominal pathology. Because of the tenderness that she is having a CT scan is warranted to rule out further symptoms such as colitis, right-sided diverticulitis, AAA  though this is less likely.  #2 chest pain, chest x-ray pending, EKG unremarkable, no history of coronary obstructive disease and with recent workup this makes this less likely. We'll obtain outside records.    On review of the labs there does not appear to be any acute findings, however the urinalysis shows positive nitrates and small leukocytes, few bacteria, 6-30 white blood cells. No leukocytosis, normal metabolic panel and troponin. EKG, chest x-ray and CT scan also showed no signs of any abnormal findings. We'll culture the urine, treat with Cipro, the patient will be given a small mount a medication for home tonight, Phenergan IM prior to discharge. She states that she is able to take Phenergan without any difficulties   Eber Hong, MD 10/18/15 2100

## 2015-10-18 NOTE — ED Notes (Signed)
Hydrocodone 6 pack given to patient with nurse, Verlene MayerBrandy Daniels as witness.

## 2015-10-21 MED FILL — Hydrocodone-Acetaminophen Tab 5-325 MG: ORAL | Qty: 6 | Status: AC

## 2015-10-22 LAB — URINE CULTURE: Culture: >=100000

## 2015-10-23 ENCOUNTER — Telehealth (HOSPITAL_COMMUNITY): Payer: Self-pay

## 2015-10-23 NOTE — Telephone Encounter (Signed)
Post ED Visit - Positive Culture Follow-up  Culture report reviewed by antimicrobial stewardship pharmacist:   Enzo Bi, Pharm.D.  Celedonio Miyamoto, Pharm.D., BCPS  Garvin Fila, Pharm.D.  Georgina Pillion, Pharm.D., BCPS  [place], [place].D., BCPS, AAHIVP  Estella Husk, Pharm.D., BCPS, AAHIVP  Cassie Stewart, [address].D.  Rob Oswaldo Done, [address]DJacinto Reap, Pharm.D.  Positive urine culture, >/= 100,000 colonies -> E Coli Treated with Ciprofloxacin, organism sensitive to the same and no further patient follow-up is required at this time.  Arvid Right 10/23/2015, 1:21 PM

## 2017-10-08 IMAGING — CT CT ABD-PELV W/O CM
2 of 4 series · 15 of 46 positions shown, 17 images · non-contrast
Comparison: September 24, 2015

CLINICAL DATA: Right lower quadrant and right flank pain for 1 day

EXAM:
CT ABDOMEN AND PELVIS WITHOUT CONTRAST
TECHNIQUE: Multidetector CT imaging of the abdomen and pelvis was performed
following the standard protocol without IV contrast. Oral contrast
was administered.

[Series 2: abdomen/pelvis w/o contrast · axial · non-contrast · 0.66mm/px · z∈[-461,-31]mm · 12 of 94 slices shown, 14 images]
[im 4/94  soft-tissue]
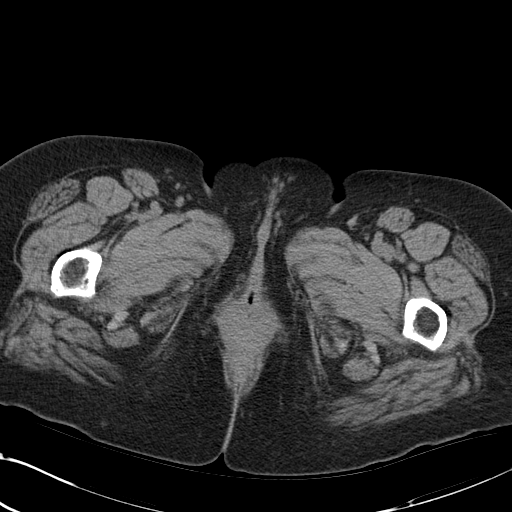
[im 4/94  bone]
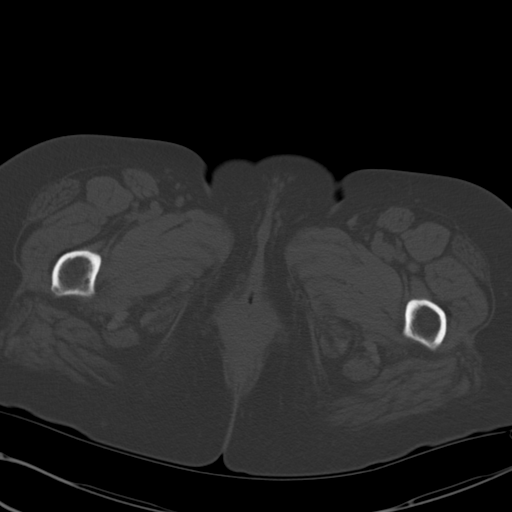
[im 12/94  soft-tissue]
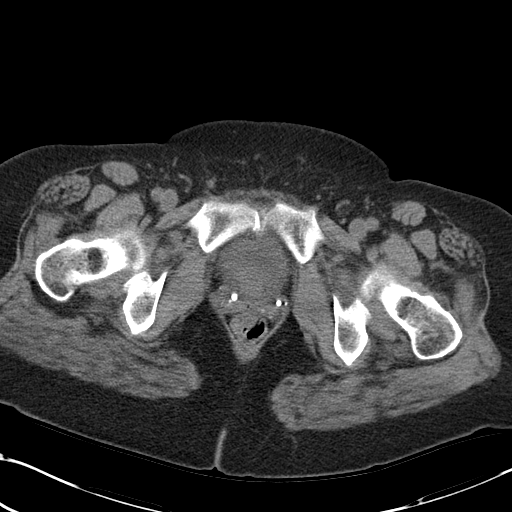
[im 20/94  soft-tissue]
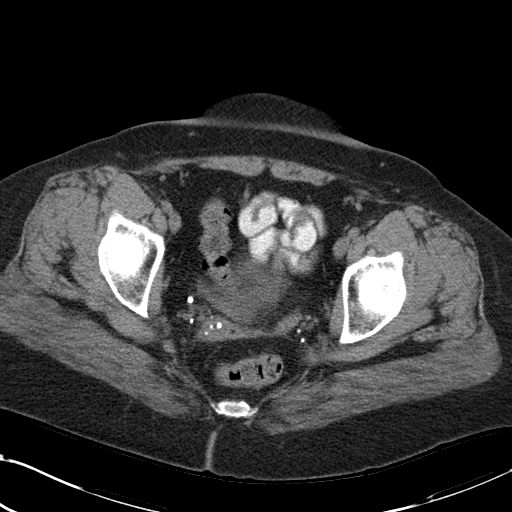
[im 28/94  soft-tissue]
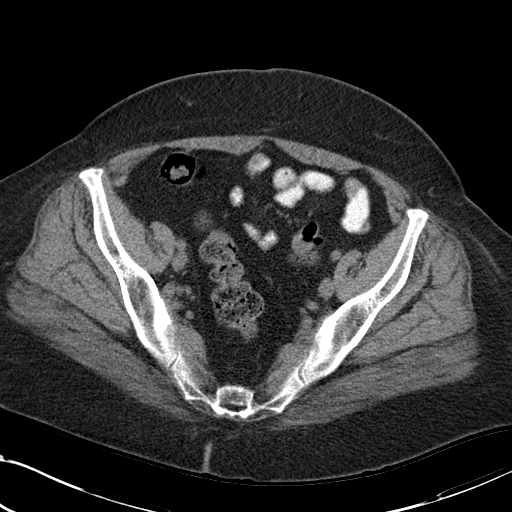
[im 35/94  soft-tissue]
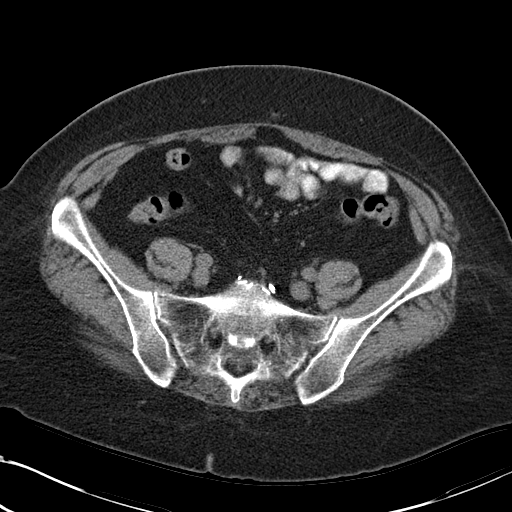
[im 43/94  soft-tissue]
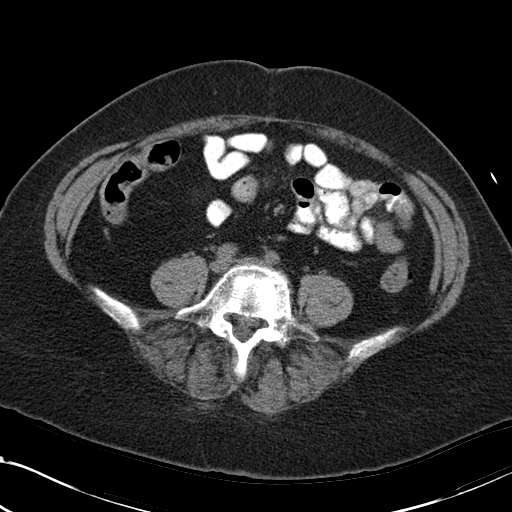
[im 51/94  soft-tissue]
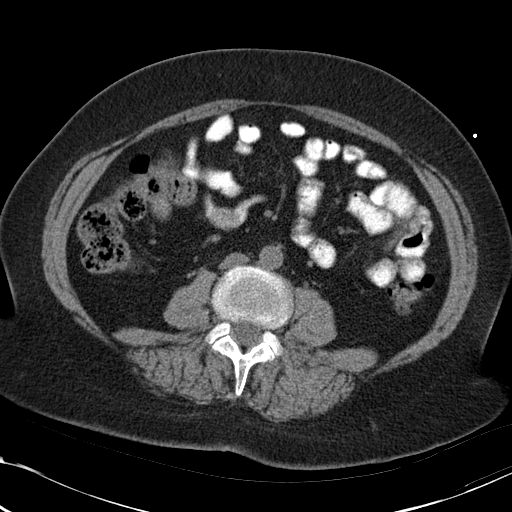
[im 59/94  soft-tissue]
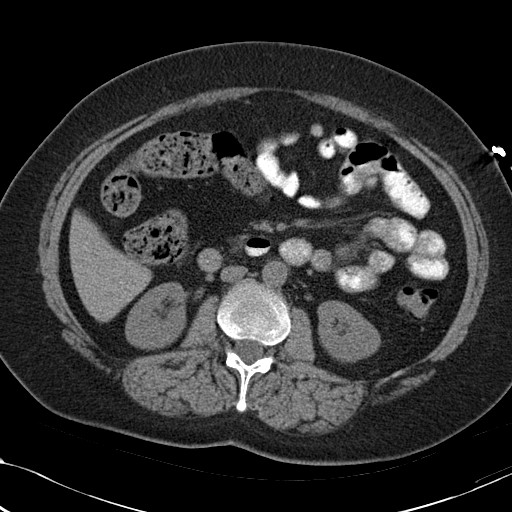
[im 66/94  soft-tissue]
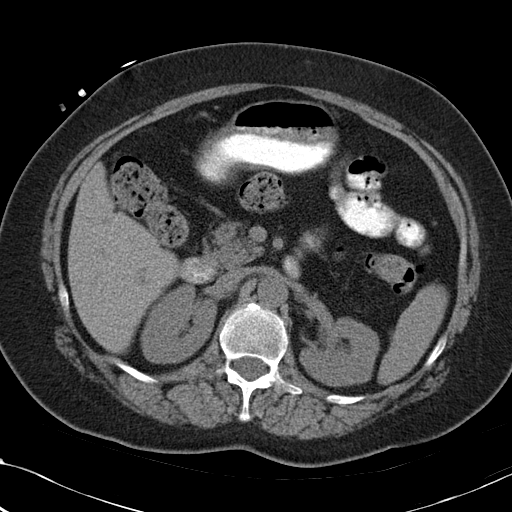
[im 66/94  bone]
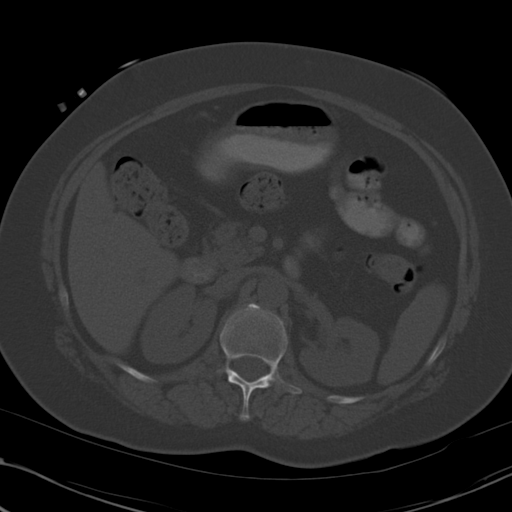
[im 74/94  soft-tissue]
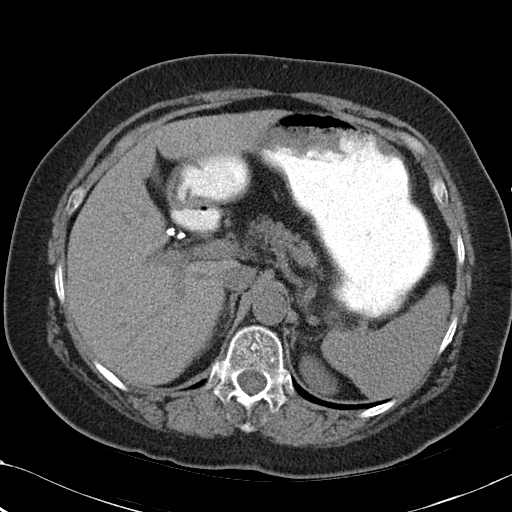
[im 82/94  soft-tissue]
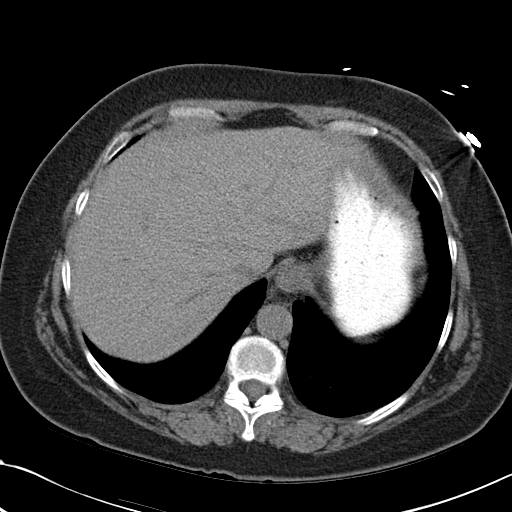
[im 90/94  soft-tissue]
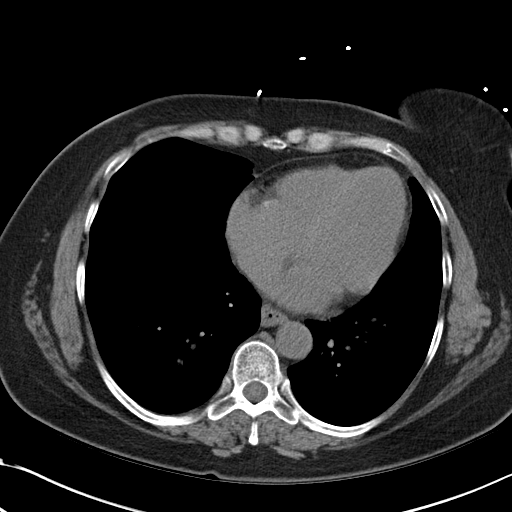

[Series 4: mpr cor (id) · coronal · 0.67mm/px · 3 of 93 slices shown]
[im 31/93  soft-tissue]
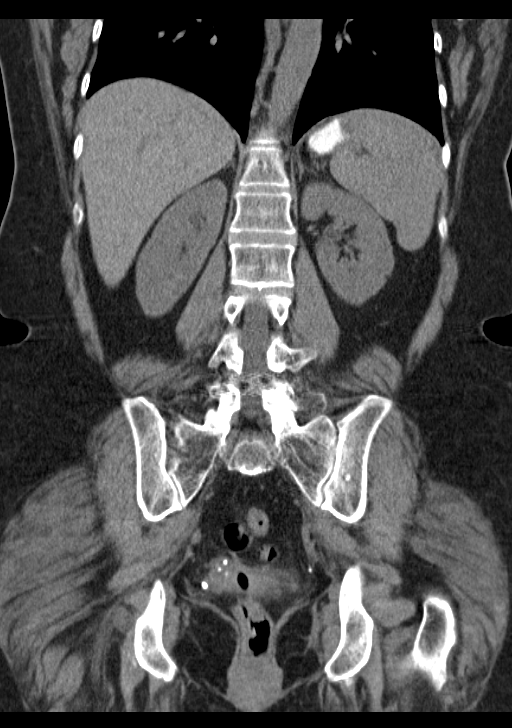
[im 41/93  soft-tissue]
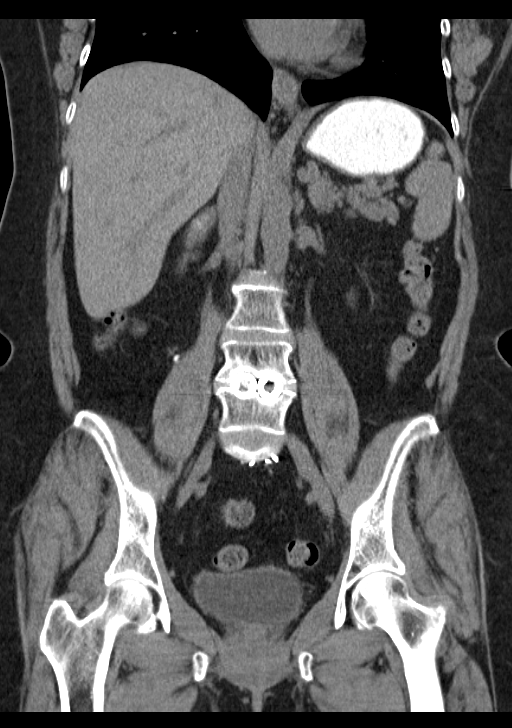
[im 52/93  soft-tissue]
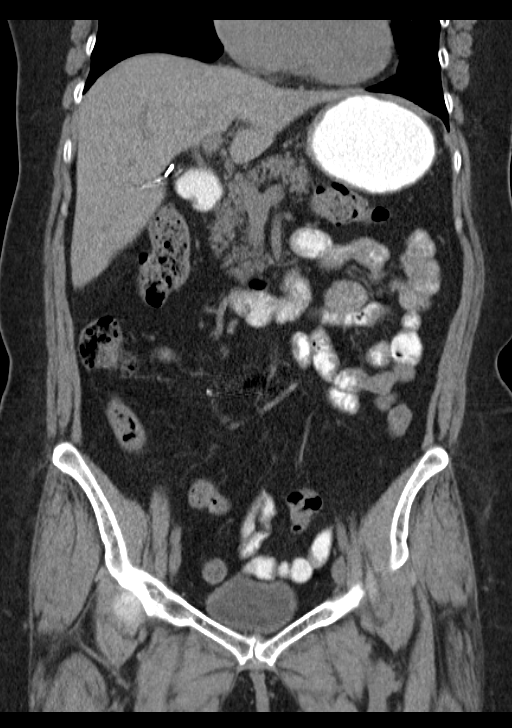

[15 of 46 positions shown; findings below may reference images not displayed]

FINDINGS: Lower chest:  Lung bases are clear.

Hepatobiliary: No focal liver lesions are identified on this
noncontrast enhanced study. Gallbladder is absent. There is no
appreciable biliary duct dilatation.

Pancreas: No pancreatic mass or inflammatory focus.

Spleen: No splenic lesions are identified.

Adrenals/Urinary Tract: Adrenals appear normal bilaterally. There is
no renal mass or hydronephrosis on either side. There is a stable 4
mm calculus in the lateral mid right kidney. No other intrarenal
calculi are identified. There are no ureteral calculi on either
side. Multiple pelvic phleboliths are near but separate from the
ureters. Urinary bladder is midline with wall thickness within
normal limits.

Stomach/Bowel: There are scattered sigmoid diverticula without
diverticulitis. There is no bowel wall or mesenteric thickening. No
bowel obstruction. No free air or portal venous air.

Vascular/Lymphatic: There is no abdominal aortic aneurysm. No
vascular lesions are appreciable on this noncontrast enhanced study.
There is no apparent adenopathy by size criteria in the abdomen or
pelvis. Note that there are several subcentimeter lymph nodes in the
right mid to lower abdomen.

Reproductive: Uterus is absent. There is no pelvic mass or pelvic
fluid collection.

Other: Appendix is absent. No ascites or abscess in the abdomen or
pelvis. There is no right lower quadrant inflammatory lesion. There
is a minimal ventral hernia containing only fat.

Musculoskeletal: There is postoperative change at L4-5. There are no
blastic or lytic bone lesions. There is no intramuscular or
abdominal wall lesion.
IMPRESSION: Nonobstructing calculus mid right kidney. No hydronephrosis or
ureteral calculus on either side.

Uterus, appendix, and gallbladder absent. There is no bowel
obstruction. No abscess.

No adenopathy by size criteria in the abdomen or pelvis. Several
small lymph nodes in the right mid to lower abdomen or noted. In the
appropriate clinical setting, these lymph nodes potentially could
represent a degree of mesenteric adenitis.

Postoperative change at L4-5. Minimal ventral hernia containing only
fat. There are scattered sigmoid diverticula without diverticulitis.

## 2017-11-05 ENCOUNTER — Encounter (HOSPITAL_COMMUNITY): Payer: Self-pay

## 2017-11-05 ENCOUNTER — Emergency Department (HOSPITAL_COMMUNITY)
Admission: EM | Admit: 2017-11-05 | Discharge: 2017-11-05 | Disposition: A | Discharge status: Home / Self Care | Payer: Medicare Other | Attending: Emergency Medicine | Admitting: Emergency Medicine

## 2017-11-05 ENCOUNTER — Other Ambulatory Visit: Payer: Self-pay

## 2017-11-05 DIAGNOSIS — Z5321 Procedure and treatment not carried out due to patient leaving prior to being seen by health care provider: Secondary | ICD-10-CM | POA: Diagnosis not present

## 2017-11-05 DIAGNOSIS — R1030 Lower abdominal pain, unspecified: Secondary | ICD-10-CM | POA: Insufficient documentation

## 2017-11-05 LAB — URINALYSIS, ROUTINE W REFLEX MICROSCOPIC
Bacteria, UA: NONE SEEN
Bilirubin Urine: NEGATIVE
GLUCOSE, UA: NEGATIVE mg/dL
Ketones, ur: NEGATIVE mg/dL
Leukocytes, UA: NEGATIVE
NITRITE: NEGATIVE
PH: 5 (ref 5.0–8.0)
PROTEIN: NEGATIVE mg/dL
Specific Gravity, Urine: 1.014 (ref 1.005–1.030)

## 2017-11-05 NOTE — ED Triage Notes (Signed)
Reports of walking around in store today and had sudden onset of lower abdominal pain with nausea. Denies urinary symptoms. Last BM last night.

## 2017-11-05 NOTE — ED Notes (Signed)
Pt still not in waiting area or vending area.

## 2017-11-05 NOTE — ED Notes (Signed)
Unable to find pt in waiting area.  Called name times 2.
